# Patient Record
Sex: Female | Born: 1990 | Race: White | Hispanic: No | Marital: Single | Smoking: Current every day smoker
Health system: Southern US, Community
[De-identification: ages and names within clinical notes are randomized; demographics above are authoritative.]

## PROBLEM LIST (undated history)

## (undated) DIAGNOSIS — F329 Major depressive disorder, single episode, unspecified: Secondary | ICD-10-CM

## (undated) DIAGNOSIS — F419 Anxiety disorder, unspecified: Secondary | ICD-10-CM

## (undated) DIAGNOSIS — F32A Depression, unspecified: Secondary | ICD-10-CM

---

## 2014-06-10 ENCOUNTER — Emergency Department (HOSPITAL_COMMUNITY)
Admission: EM | Admit: 2014-06-10 | Discharge: 2014-06-11 | Disposition: A | Discharge status: Home / Self Care | Payer: Federal, State, Local not specified - Other | Attending: Emergency Medicine | Admitting: Emergency Medicine

## 2014-06-10 ENCOUNTER — Encounter (HOSPITAL_COMMUNITY): Payer: Self-pay | Admitting: *Deleted

## 2014-06-10 DIAGNOSIS — Z720 Tobacco use: Secondary | ICD-10-CM | POA: Insufficient documentation

## 2014-06-10 DIAGNOSIS — F323 Major depressive disorder, single episode, severe with psychotic features: Secondary | ICD-10-CM | POA: Diagnosis present

## 2014-06-10 DIAGNOSIS — Z880 Allergy status to penicillin: Secondary | ICD-10-CM | POA: Insufficient documentation

## 2014-06-10 DIAGNOSIS — R45851 Suicidal ideations: Secondary | ICD-10-CM | POA: Insufficient documentation

## 2014-06-10 DIAGNOSIS — Z79899 Other long term (current) drug therapy: Secondary | ICD-10-CM | POA: Insufficient documentation

## 2014-06-10 HISTORY — DX: Anxiety disorder, unspecified: F41.9

## 2014-06-10 HISTORY — DX: Depression, unspecified: F32.A

## 2014-06-10 HISTORY — DX: Major depressive disorder, single episode, unspecified: F32.9

## 2014-06-10 LAB — COMPREHENSIVE METABOLIC PANEL
ALBUMIN: 4.7 g/dL (ref 3.5–5.2)
ALT: 14 U/L (ref 0–35)
AST: 15 U/L (ref 0–37)
Alkaline Phosphatase: 85 U/L (ref 39–117)
Anion gap: 9 (ref 5–15)
BUN: 10 mg/dL (ref 6–23)
CO2: 25 mmol/L (ref 19–32)
CREATININE: 0.62 mg/dL (ref 0.50–1.10)
Calcium: 9.9 mg/dL (ref 8.4–10.5)
Chloride: 107 mmol/L (ref 96–112)
GFR calc Af Amer: >90 mL/min (ref >90)
Glucose, Bld: 97 mg/dL (ref 70–99)
Potassium: 4 mmol/L (ref 3.5–5.1)
Sodium: 141 mmol/L (ref 135–145)
Total Bilirubin: 0.4 mg/dL (ref 0.3–1.2)
Total Protein: 7.4 g/dL (ref 6.0–8.3)

## 2014-06-10 LAB — CBC
HCT: 43.4 % (ref 36.0–46.0)
Hemoglobin: 14.4 g/dL (ref 12.0–15.0)
MCH: 30 pg (ref 26.0–34.0)
MCHC: 33.2 g/dL (ref 30.0–36.0)
MCV: 90.4 fL (ref 78.0–100.0)
PLATELETS: 288 10*3/uL (ref 150–400)
RBC: 4.8 MIL/uL (ref 3.87–5.11)
RDW: 12.6 % (ref 11.5–15.5)
WBC: 7.5 10*3/uL (ref 4.0–10.5)

## 2014-06-10 LAB — RAPID URINE DRUG SCREEN, HOSP PERFORMED
AMPHETAMINES: NOT DETECTED
BENZODIAZEPINES: NOT DETECTED
Barbiturates: NOT DETECTED
Cocaine: NOT DETECTED
OPIATES: NOT DETECTED
Tetrahydrocannabinol: NOT DETECTED

## 2014-06-10 LAB — ACETAMINOPHEN LEVEL

## 2014-06-10 LAB — ETHANOL

## 2014-06-10 LAB — SALICYLATE LEVEL: Salicylate Lvl: <4 mg/dL (ref 2.8–20.0)

## 2014-06-10 MED ORDER — TRAZODONE HCL 50 MG PO TABS
50.0000 mg | ORAL_TABLET | Freq: Every evening | ORAL | Status: DC | PRN
  Administered 2014-06-10: 50 mg via ORAL
  Filled 2014-06-10: qty 1

## 2014-06-10 MED ORDER — ACETAMINOPHEN 325 MG PO TABS
650.0000 mg | ORAL_TABLET | ORAL | Status: DC | PRN
  Administered 2014-06-10: 650 mg via ORAL
  Filled 2014-06-10: qty 2

## 2014-06-10 MED ORDER — ALUM & MAG HYDROXIDE-SIMETH 200-200-20 MG/5ML PO SUSP: 30.0000 mL | ORAL | Status: DC | PRN

## 2014-06-10 MED ORDER — LORAZEPAM 1 MG PO TABS
1.0000 mg | ORAL_TABLET | Freq: Three times a day (TID) | ORAL | Status: DC | PRN
  Administered 2014-06-10: 1 mg via ORAL
  Filled 2014-06-10: qty 1

## 2014-06-10 MED ORDER — IBUPROFEN 200 MG PO TABS
600.0000 mg | ORAL_TABLET | Freq: Three times a day (TID) | ORAL | Status: DC | PRN
  Administered 2014-06-10: 600 mg via ORAL
  Filled 2014-06-10: qty 3

## 2014-06-10 MED ORDER — ONDANSETRON HCL 4 MG PO TABS
4.0000 mg | ORAL_TABLET | Freq: Three times a day (TID) | ORAL | Status: DC | PRN
Start: 2014-06-10 — End: 2014-06-11
  Administered 2014-06-10: 4 mg via ORAL
  Filled 2014-06-10: qty 1

## 2014-06-10 NOTE — BH Assessment (Signed)
Per ED notes pt presented to ED due depression, anxiety, and feeling like she does not want to be here anymore. She has been previously dx with depression 7 years ago, and took prozac but felt it made her a "zombie." She currently is not on medication due to insurance.   Assessment to commence shortly.    Clista BernhardtNancy Sera Hitsman, [facility] Triage Specialist 06/10/2014 10:37 PM

## 2014-06-10 NOTE — ED Notes (Signed)
Pt and one pt belonging bag wanded by security.

## 2014-06-10 NOTE — ED Notes (Signed)
Pt reports depression and severe anxiety.  States she recently moved here to go to school and has been dealing with a lot of stuff lately that it's making her feel like she does not want to be "here" anymore.  No plan.  Pt is teary in triage, however, calm and cooperative. Pt states that she was dx with depression x 7 years ago and took prozac for 5 years which only made her feel like a "zombie."  Pt states that she does not have insurance so she has not taken her meds.  She is requesting help with this.
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
## 2014-06-10 NOTE — ED Notes (Signed)
TTS in progress 

## 2014-06-11 ENCOUNTER — Inpatient Hospital Stay (HOSPITAL_COMMUNITY)
Admission: AD | Admit: 2014-06-11 | Discharge: 2014-06-17 | DRG: 885 | Disposition: A | Discharge status: Home / Self Care | Payer: Federal, State, Local not specified - Other | Source: Intra-hospital | Attending: Psychiatry | Admitting: Psychiatry

## 2014-06-11 ENCOUNTER — Encounter (HOSPITAL_COMMUNITY): Payer: Self-pay | Admitting: *Deleted

## 2014-06-11 DIAGNOSIS — G9340 Encephalopathy, unspecified: Secondary | ICD-10-CM | POA: Diagnosis present

## 2014-06-11 DIAGNOSIS — F4310 Post-traumatic stress disorder, unspecified: Secondary | ICD-10-CM | POA: Diagnosis present

## 2014-06-11 DIAGNOSIS — F323 Major depressive disorder, single episode, severe with psychotic features: Secondary | ICD-10-CM | POA: Diagnosis present

## 2014-06-11 DIAGNOSIS — R45851 Suicidal ideations: Secondary | ICD-10-CM | POA: Diagnosis present

## 2014-06-11 DIAGNOSIS — F17210 Nicotine dependence, cigarettes, uncomplicated: Secondary | ICD-10-CM | POA: Diagnosis present

## 2014-06-11 DIAGNOSIS — G4710 Hypersomnia, unspecified: Secondary | ICD-10-CM | POA: Diagnosis present

## 2014-06-11 DIAGNOSIS — I639 Cerebral infarction, unspecified: Secondary | ICD-10-CM | POA: Diagnosis present

## 2014-06-11 DIAGNOSIS — F410 Panic disorder [episodic paroxysmal anxiety] without agoraphobia: Secondary | ICD-10-CM | POA: Diagnosis present

## 2014-06-11 DIAGNOSIS — F333 Major depressive disorder, recurrent, severe with psychotic symptoms: Secondary | ICD-10-CM | POA: Diagnosis not present

## 2014-06-11 DIAGNOSIS — G4700 Insomnia, unspecified: Secondary | ICD-10-CM | POA: Diagnosis present

## 2014-06-11 DIAGNOSIS — F332 Major depressive disorder, recurrent severe without psychotic features: Secondary | ICD-10-CM

## 2014-06-11 MED ORDER — ACETAMINOPHEN 325 MG PO TABS: 650.0000 mg | ORAL_TABLET | Freq: Four times a day (QID) | ORAL | Status: DC | PRN

## 2014-06-11 MED ORDER — ARIPIPRAZOLE 2 MG PO TABS
2.0000 mg | ORAL_TABLET | Freq: Every day | ORAL | Status: DC
  Filled 2014-06-11: qty 1

## 2014-06-11 MED ORDER — HYDROXYZINE HCL 25 MG PO TABS
25.0000 mg | ORAL_TABLET | Freq: Three times a day (TID) | ORAL | Status: DC | PRN
  Administered 2014-06-13 – 2014-06-17 (×9): 25 mg via ORAL
  Filled 2014-06-11 (×9): qty 1
  Filled 2014-06-11: qty 30

## 2014-06-11 MED ORDER — MAGNESIUM HYDROXIDE 400 MG/5ML PO SUSP: 30.0000 mL | Freq: Every day | ORAL | Status: DC | PRN

## 2014-06-11 MED ORDER — ALUM & MAG HYDROXIDE-SIMETH 200-200-20 MG/5ML PO SUSP: 30.0000 mL | ORAL | Status: DC | PRN

## 2014-06-11 NOTE — ED Notes (Signed)
Patient tearful during assessment and appears anxious. Writer and patient had therapeutic discussion. [facility] but denies HI and AVH at this time. Patient complains of a headache and nausea. Patient rates pain 7/10 using numeric pain scale. Patient will be medicate [facility] for nausea, pain and increase anxiety per MAR. Patient oriented to unit and plan of care discussed. Patient voices no complaints or concerns at this time. Encouragement and support provided and safety maintain. Q 15 min safety checks in place.

## 2014-06-11 NOTE — ED Provider Notes (Signed)
CSN: [id number]     Arrival date & time 06/10/14  1929 History   First MD Initiated Contact with Patient 06/10/14 2003     Chief Complaint  Patient presents with  . Suicidal     (Consider location/radiation/quality/duration/timing/severity/associated sxs/prior Treatment) HPI Patient presents to the emergency department with depression and severe anxiety states that she has had recent life changes that it caused further worsening of her condition.  She states she has had thoughts of suicide and has Formulated a plan, but is not very specific with me.  Patient states that nothing seems make her condition better or worse.  The patient denies hallucinations, chest pain, shortness of breath, weakness, dizziness, headache, blurred vision, nausea, vomiting, diarrhea, abdominal pain, or syncope.  The patient states she is not currently taking any medications for her psychiatric related issues Past Medical History  Diagnosis Date  . Depression   . Anxiety    History reviewed. No pertinent past surgical history. No family history on file. History  Substance Use Topics  . Smoking status: Current Every Day Smoker -- 1.00 packs/day    Types: Cigarettes  . Smokeless tobacco: Not on file  . Alcohol Use: Yes     Comment: social   OB History    No data available     Review of Systems  All other systems negative except as documented in the HPI. All pertinent positives and negatives as reviewed in the HPI.  Allergies  Penicillins  Home Medications   Prior to Admission medications   Medication Sig Start Date End Date Taking? Authorizing Provider  Acetaminophen-Caff-Pyrilamine (MIDOL COMPLETE PO) Take 2 capsules by mouth every 6 (six) hours as needed (cramps).   Yes Historical Provider, MD  hydrOXYzine (ATARAX/VISTARIL) 25 MG tablet Take 25 mg by mouth 3 (three) times daily.   Yes Historical Provider, MD   BP 123/78 mmHg  Pulse 80  Temp(Src) 98.1 F (36.7 C) (Oral)  Resp 16  SpO2 100%   LMP 06/07/2014 Physical Exam  Constitutional: She is oriented to person, place, and time. She appears well-developed and well-nourished. No distress.  HENT:  Head: Normocephalic and atraumatic.  Mouth/Throat: Oropharynx is clear and moist.  Eyes: Pupils are equal, round, and reactive to light.  Neck: Normal range of motion. Neck supple.  Cardiovascular: Normal rate, regular rhythm and normal heart sounds.   Pulmonary/Chest: Effort normal and breath sounds normal. No respiratory distress.  Neurological: She is alert and oriented to person, place, and time. She exhibits normal muscle tone. Coordination normal.  Skin: Skin is warm and dry. No rash noted. No erythema.  Psychiatric: Judgment normal. Her mood appears anxious. She is not agitated and not actively hallucinating. Thought content is not paranoid and not delusional. Cognition and memory are normal. She exhibits a depressed mood. She expresses suicidal ideation. She expresses no homicidal ideation. She expresses suicidal plans. She expresses no homicidal plans. She is attentive.  Nursing note and vitals reviewed.   ED Course  Procedures (including critical care time) Labs Review Labs Reviewed  ACETAMINOPHEN LEVEL - Abnormal; Notable for the following:    Acetaminophen (Tylenol), Serum <10.0 (*)    All other components within normal limits  CBC  COMPREHENSIVE METABOLIC PANEL  ETHANOL  SALICYLATE LEVEL  URINE RAPID DRUG SCREEN (HOSP PERFORMED)     Patient will need TTS assessment   Charlestine NightChristopher Tavone Caesar, PA-C 06/11/14 0146  Elwin MochaBlair Walden, MD 06/17/14 [phone]

## 2014-06-11 NOTE — BH Assessment (Signed)
Per Donell SievertSpencer Simon, PA pt meets inpt criteria and [facility]. Per Bunnie Pionori AC, no [facility] at [facility]. TTS to seek placement.   [place]: [place] [place], [place], [facility], [facility]    Bhavika Schnider, [place] Triage Specialist 06/11/2014 5:31 AM

## 2014-06-11 NOTE — ED Notes (Signed)
Acuity level remains moderate.  Very sad and having suicidal thoughts.

## 2014-06-11 NOTE — Consult Note (Signed)
[facility] Face-to-Face Psychiatry Consult   Reason for Consult:  Major depression, anxiety disorder Referring Physician:  EDP Patient Identification: Kristina Guerrero MRN:  [id number] Principal Diagnosis: Major depressive disorder, recurrent severe without psychotic features Diagnosis:   Patient Active Problem List   Diagnosis Date Noted  . Major depressive disorder, recurrent severe without psychotic features [F33.2] 06/11/2014    Priority: High    Total Time spent with patient: 1 hour  Subjective:   Kristina Guerrero is a 24 y.o. female patient admitted with Major depressive disorder, recurrent, anxiety disorder.  HPI:  Caucasian female, 24 years old was evaluated for severe depression and anxiety.  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  Patient stated " I need help to stop feeling the way I am feeling"  Patient reported that She left her boy friend and her friends to move down to [place] when she dropped out of school to secure a job until she can go back to school.  She reported that thing are not working as planned and that she does not know what to do.  She reports that she is living with two people who are husband and wife and that she is the only one working and paying bills.  Patient  Was tearful during this interview.  Patient reported that she has been in foster care from age 24 to 60 because her mother could not care for her due to her own issues which she did not want to talk about.  Patient was diagnosed with depression and anxiety at age 54 and have been tried on various medications.  She reported that some of the medications she tried made her feel like" a Zombie "  Patient states that she worries about a lot of people including her mother and that adds to her stress.  She denies HI/AVH.  She has been accepted for admission and will be waiting for any available inpatient Psychiatric bed.  Meanwhile she has been started on medications.    HPI Elements:   Location:  Major depression, recurrent, severe, anxiety disorder. Quality:  severe. Severity:  severe. Timing:  acute. Duration:  since age 57 years . Context:  Seeking treatment for severe depression and anxiety.  Past Medical History:  Past Medical History  Diagnosis Date  . Depression   . Anxiety    History reviewed. No pertinent past surgical history. Family History: No family history on file. Social History:  History  Alcohol Use  . Yes    Comment: social     History  Drug Use No    History   Social History  . Marital Status: Single    Spouse Name: N/A  . Number of Children: N/A  . Years of Education: N/A   Social History Main Topics  . Smoking status: Current Every Day Smoker -- 1.00 packs/day    Types: Cigarettes  . Smokeless tobacco: Not on file  . Alcohol Use: Yes     Comment: social  . Drug Use: No  . Sexual Activity: Not on file   Other Topics Concern  . None   Social History Narrative  . None   Additional Social History:    Pain Medications: reports she was taking pain pills not prescribed to her up until about two months ago  Prescriptions: none reported Over the Counter: SEE PTA, reports take midol  History of alcohol / drug use?: Yes Longest period of sobriety (when/how long): a couple  of weeks for THC, about two month for pain medications  Negative Consequences of Use: Financial Withdrawal Symptoms:  (none reported ) Name of Substance 1: etoh  1 - Age of First Use: teens  1 - Amount (size/oz): a glass 1 - Frequency: "when I can afford it, would have a glass before bed nightly if I could" 1 - Duration: years 1 - Last Use / Amount: uncertain Name of Substance 2: THC 2 - Age of First Use: teens  2 - Amount (size/oz): uncertain  2 - Frequency: varied, reported recently made her feel paranoid so stopped a couple of weeks ago  2 - Duration: on and off for years 2 - Last Use / Amount: "sometime this month" Name of  Substance 3: opiate pain pills 3 - Age of First Use: teens 3 - Amount (size/oz): varied reports sometimes took for pain, and other times abused it, it was never prescribed 3 - Frequency: daily at times 3 - Duration: years 3 - Last Use / Amount: a couple of months ago Name of Substance 4: cocaine 4 - Age of First Use: teens 4 - Amount (size/oz): unknown reports experimented a couple of times 4 - Frequency: a few times Name of Substance 5: xanax, klonopin 5 - Age of First Use: teens 5 - Amount (size/oz): reports took a few times but did not like it because it made her go to sleep Name of Substance 6: ambien 43 - Age of First Use: teens  6 - Amount (size/oz): uncertain         Allergies:   Allergies  Allergen Reactions  . Penicillins     Labs:  Results for orders placed or performed during the hospital encounter of 06/10/14 (from the past 48 hour(s))  Acetaminophen level     Status: Abnormal   Collection Time: 06/10/14  8:25 PM  Result Value Ref Range   Acetaminophen (Tylenol), Serum <10.0 (L) 10 - 30 ug/mL    Comment:        THERAPEUTIC CONCENTRATIONS VARY SIGNIFICANTLY. A RANGE OF 10-30 ug/mL MAY BE AN EFFECTIVE CONCENTRATION FOR MANY PATIENTS. HOWEVER, SOME ARE BEST TREATED AT CONCENTRATIONS OUTSIDE THIS RANGE. ACETAMINOPHEN CONCENTRATIONS >150 ug/mL AT 4 HOURS AFTER INGESTION AND >50 ug/mL AT 12 HOURS AFTER INGESTION ARE OFTEN ASSOCIATED WITH TOXIC REACTIONS.   CBC     Status: None   Collection Time: 06/10/14  8:25 PM  Result Value Ref Range   WBC 7.5 4.0 - 10.5 K/uL   RBC 4.80 3.87 - 5.11 MIL/uL   Hemoglobin 14.4 12.0 - 15.0 g/dL   HCT 43.4 36.0 - 46.0 %   MCV 90.4 78.0 - 100.0 fL   MCH 30.0 26.0 - 34.0 pg   MCHC 33.2 30.0 - 36.0 g/dL   RDW 12.6 11.5 - 15.5 %   Platelets 288 150 - 400 K/uL  Comprehensive metabolic panel     Status: None   Collection Time: 06/10/14  8:25 PM  Result Value Ref Range   Sodium 141 135 - 145 mmol/L   Potassium 4.0 3.5 - 5.1  mmol/L   Chloride 107 96 - 112 mmol/L   CO2 25 19 - 32 mmol/L   Glucose, Bld 97 70 - 99 mg/dL   BUN 10 6 - 23 mg/dL   Creatinine, Ser 0.62 0.50 - 1.10 mg/dL   Calcium 9.9 8.4 - 10.5 mg/dL   Total Protein 7.4 6.0 - 8.3 g/dL   Albumin 4.7 3.5 - 5.2 g/dL   AST  15 0 - 37 U/L   ALT 14 0 - 35 U/L   Alkaline Phosphatase 85 39 - 117 U/L   Total Bilirubin 0.4 0.3 - 1.2 mg/dL   GFR calc non Af Amer >90 >90 mL/min   GFR calc Af Amer >90 >90 mL/min    Comment: (NOTE) The eGFR has been calculated using the CKD EPI equation. This calculation has not been validated in all clinical situations. eGFR's persistently <90 mL/min signify possible Chronic Kidney Disease.    Anion gap 9 5 - 15  Ethanol (ETOH)     Status: None   Collection Time: 06/10/14  8:25 PM  Result Value Ref Range   Alcohol, Ethyl (B) <5 0 - 9 mg/dL    Comment:        LOWEST DETECTABLE LIMIT FOR SERUM ALCOHOL IS 11 mg/dL FOR MEDICAL PURPOSES ONLY   Salicylate level     Status: None   Collection Time: 06/10/14  8:25 PM  Result Value Ref Range   Salicylate Lvl <9.7 2.8 - 20.0 mg/dL  Urine Drug Screen     Status: None   Collection Time: 06/10/14  9:23 PM  Result Value Ref Range   Opiates NONE DETECTED NONE DETECTED   Cocaine NONE DETECTED NONE DETECTED   Benzodiazepines NONE DETECTED NONE DETECTED   Amphetamines NONE DETECTED NONE DETECTED   Tetrahydrocannabinol NONE DETECTED NONE DETECTED   Barbiturates NONE DETECTED NONE DETECTED    Comment:        DRUG SCREEN FOR MEDICAL PURPOSES ONLY.  IF CONFIRMATION IS NEEDED FOR ANY PURPOSE, NOTIFY LAB WITHIN 5 DAYS.        LOWEST DETECTABLE LIMITS FOR URINE DRUG SCREEN Drug Class       Cutoff (ng/mL) Amphetamine      1000 Barbiturate      200 Benzodiazepine   026 Tricyclics       378 Opiates          300 Cocaine          300 THC              50     Vitals: Blood pressure 93/65, pulse 63, temperature 98.1 F (36.7 C), temperature source Oral, resp. rate 16, last  menstrual period 06/07/2014, SpO2 100 %.  Risk to Self: Suicidal Ideation: Yes-Currently Present Suicidal Intent: No Is patient at risk for suicide?: Yes Suicidal Plan?: No Access to Means: No What has been your use of drugs/alcohol within the last 12 months?: Pt reports using etoh, THC, opiates, benzos, sleeping pills starting in her teens. Currently she reports only use of etoh. She reports she stopped THC a couple of weeks ago, and pain pills about two months ago.  How many times?: 1 Other Self Harm Risks: none Triggers for Past Attempts: Other (Comment) (in placement) Intentional Self Injurious Behavior: Cutting (pinches self, touches sharps) Comment - Self Injurious Behavior: reports she cut as a teen, currently pinches herself and touches sharp things Risk to Others: Homicidal Ideation: No Thoughts of Harm to Others: No-Not Currently Present/Within Last 6 Months Current Homicidal Intent: No Current Homicidal Plan: No Access to Homicidal Means: No Identified Victim: none History of harm to others?: No Assessment of Violence: None Noted Violent Behavior Description: reports gets upset and has to leave room to avoid hitting, reprots violent towards objects not people  Does patient have access to weapons?: No Criminal Charges Pending?: No Does patient have a court date: No Prior Inpatient Therapy: Prior Inpatient Therapy:  No Prior Therapy Dates: NA Prior Therapy Facilty/Provider(s): NA Reason for Treatment: NA Prior Outpatient Therapy: Prior Outpatient Therapy: Yes Prior Therapy Dates: when she was 17 Prior Therapy Facilty/Provider(s): unknown Reason for Treatment: depression, anxiety, in placement   Current Facility-Administered Medications  Medication Dose Route Frequency Provider Last Rate Last Dose  . acetaminophen (TYLENOL) tablet 650 mg  650 mg Oral Q4H PRN Dalia Heading, PA-C   650 mg at 06/10/14 2151  . alum & mag hydroxide-simeth (MAALOX/MYLANTA) 200-200-20 MG/5ML  suspension 30 mL  30 mL Oral PRN Dalia Heading, PA-C      . ARIPiprazole (ABILIFY) tablet 2 mg  2 mg Oral QHS Babygirl Trager      . ibuprofen (ADVIL,MOTRIN) tablet 600 mg  600 mg Oral Q8H PRN Dalia Heading, PA-C   600 mg at 06/10/14 2340  . LORazepam (ATIVAN) tablet 1 mg  1 mg Oral Q8H PRN Dalia Heading, PA-C   1 mg at 06/10/14 2150  . ondansetron (ZOFRAN) tablet 4 mg  4 mg Oral Q8H PRN Dalia Heading, PA-C   4 mg at 06/10/14 2150  . traZODone (DESYREL) tablet 50 mg  50 mg Oral QHS,MR X 1 Laverle Hobby, PA-C   50 mg at 06/10/14 2340   Current Outpatient Prescriptions  Medication Sig Dispense Refill  . Acetaminophen-Caff-Pyrilamine (MIDOL COMPLETE PO) Take 2 capsules by mouth every 6 (six) hours as needed (cramps).    . hydrOXYzine (ATARAX/VISTARIL) 25 MG tablet Take 25 mg by mouth 3 (three) times daily.      Musculoskeletal: Strength & Muscle Tone: within normal limits Gait & Station: normal Patient leans: N/A  Psychiatric Specialty Exam:     Blood pressure 93/65, pulse 63, temperature 98.1 F (36.7 C), temperature source Oral, resp. rate 16, last menstrual period 06/07/2014, SpO2 100 %.There is no height or weight on file to calculate BMI.  General Appearance: Casual and Fairly Groomed  Eye Contact::  Good  Speech:  Clear and Coherent and Normal Rate  Volume:  Normal  Mood:  Angry, Anxious, Depressed, Hopeless and helpless  Affect:  Congruent, Depressed and Flat  Thought Process:  Coherent, Goal Directed and Intact  Orientation:  Full (Time, Place, and Person)  Thought Content:  WDL  Suicidal Thoughts:  Yes.  without intent/plan  Homicidal Thoughts:  No  Memory:  Immediate;   Good Recent;   Good Remote;   Good  Judgement:  Fair  Insight:  Fair  Psychomotor Activity:  Psychomotor Retardation  Concentration:  Good  Recall:  NA  Fund of Knowledge:Fair  Language: Good  Akathisia:  NA  Handed:  Right  AIMS (if indicated):     Assets:  Desire for  Improvement  ADL's:  Intact  Cognition: WNL  Sleep:      Medical Decision Making: Review of Psycho-Social Stressors (1), Established Problem, Worsening (2), Review of Medication Regimen & Side Effects (2) and Review of New Medication or Change in Dosage (2)  Treatment Plan Summary: Daily contact with patient to assess and evaluate symptoms and progress in treatment, Medication management and Plan accepted for admission and will be waiting for bed availability  Plan:  Recommend psychiatric Inpatient admission when medically cleared. Disposition: see above  Delfin Gant   PMHNP-BC 06/11/2014 4:53 PM Patient seen face-to-face for psychiatric evaluation, chart reviewed and case discussed with the physician extender and developed treatment plan. Reviewed the information documented and agree with the treatment plan. Corena Pilgrim, MD

## 2014-06-11 NOTE — ED Notes (Signed)
Acuity level moderate.  Patient is very depressed and has limited insight.

## 2014-06-11 NOTE — ED Notes (Signed)
Patient reports a decrease in headache but still has minor pain. Patient also request something to help her sleep. Karleen HampshireSpencer, PA notified and new orders received.  Patient will be medicated per MAR. Encouragement and support provided and safety maintain. Q 15 min safety checks remain in place.

## 2014-06-11 NOTE — Progress Notes (Signed)
[facility], tearful but cooperative. Pt presented to ED c/o +SI, no plan, depression and severe anxiety. Reports stressors are moving here to go to school dropped out due to issues with financial aid, new job, living with couple and paying the bills; also worried about her mother (who has been in/out of the hospital).  Hx of depression since age 24, in foster care from age [age], one suicide attempt as a teenager and noncompliant with medications, stopped due to no insurance. -HI, -A/[place] at present but reported seeing shadows,  hearing  voices making negative comments to her,  telling her to give up or jump off a bridge and sometimes encouraging comments. c/o crying spells , anxiety, panic attacks, racing thoughts, worrying, loss of motivation, feeling hopeless and helpless. Admits to a history of drug abuse but reported stopping two months ago. Emotional support and encouragement given. Pt admitted for evaluation, stabilization and reduction of baseline. Will monitor closely.

## 2014-06-11 NOTE — [facility] Counselor (Signed)
[facility] by Josephine,NP and Dr. Jannifer FranklinAkintayo. Room assignment is 402-2. Nursing report [id number]. Support paperwork completed.

## 2014-06-11 NOTE — Tx Team (Signed)
Initial Interdisciplinary Treatment Plan   PATIENT STRESSORS: Educational concerns Financial difficulties Health problems Marital or family conflict Medication change or noncompliance Occupational concerns Substance abuse   PATIENT STRENGTHS: Ability for insight Active sense of humor Average or above average intelligence Communication skills Motivation for treatment/growth Supportive family/friends Work skills   PROBLEM LIST: Problem List/Patient Goals Date to be addressed Date deferred Reason deferred Estimated date of resolution  Depression "I need to get help and better" 06/11/14   At d/c  Suicidal ideation 06/11/14   At d/c  anxiety 06/11/14   At d/c  Substance abuse 06/11/14   At d/c  psychosis 06/11/14   At d/c                           DISCHARGE CRITERIA:  Ability to meet basic life and health needs Improved stabilization in mood, thinking, and/or behavior Motivation to continue treatment in a less acute level of care Need for constant or close observation no longer present  PRELIMINARY DISCHARGE PLAN: Outpatient therapy Return to previous living arrangement Return to previous work or school arrangements  PATIENT/FAMIILY INVOLVEMENT: This treatment plan has been presented to and reviewed with the patient, Kristina Guerrero.  The patient and family have been given the opportunity to ask questions and make suggestions.  Celene KrasRobinson, Luie Laneve G 06/11/2014, 10:22 PM

## 2014-06-11 NOTE — ED Notes (Signed)
Acuity level is moderate.  Patient admitted with suicidal thoughts, no plan.  Very tearful.

## 2014-06-12 ENCOUNTER — Encounter (HOSPITAL_COMMUNITY): Payer: Self-pay | Admitting: Psychiatry

## 2014-06-12 DIAGNOSIS — F333 Major depressive disorder, recurrent, severe with psychotic symptoms: Principal | ICD-10-CM

## 2014-06-12 LAB — TSH: TSH: 0.661 u[IU]/mL (ref 0.350–4.500)

## 2014-06-12 MED ORDER — NICOTINE 21 MG/24HR TD PT24
21.0000 mg | MEDICATED_PATCH | Freq: Every day | TRANSDERMAL | Status: DC
  Filled 2014-06-12 (×7): qty 1

## 2014-06-12 MED ORDER — CITALOPRAM HYDROBROMIDE 20 MG PO TABS
20.0000 mg | ORAL_TABLET | Freq: Every day | ORAL | Status: DC
  Administered 2014-06-12 – 2014-06-17 (×6): 20 mg via ORAL
  Filled 2014-06-12 (×7): qty 1

## 2014-06-12 MED ORDER — ENSURE ENLIVE PO LIQD
237.0000 mL | Freq: Two times a day (BID) | ORAL | Status: DC
  Administered 2014-06-12 – 2014-06-16 (×6): 237 mL via ORAL

## 2014-06-12 MED ORDER — QUETIAPINE FUMARATE 25 MG PO TABS
25.0000 mg | ORAL_TABLET | Freq: Four times a day (QID) | ORAL | Status: DC | PRN
  Administered 2014-06-12: 25 mg via ORAL
  Filled 2014-06-12: qty 1
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
## 2014-06-12 NOTE — Progress Notes (Signed)
Recreation Therapy Notes  Date: 04.08.2016 Time: 9:30am Location: [address] Group Room   Group Topic: Stress Management  Goal Area(s) Addresses:  Patient will actively participate in stress management techniques presented during session.   Behavioral Response: Did not attend.   Sheryn Aldaz L Doyal Saric, LRT/CTRS  Karia Ehresman L 06/12/2014 6:25 PM 
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
## 2014-06-12 NOTE — Progress Notes (Signed)
Pt calm and cooperative. Pt has been interactive in the dayroom majority of afternoon. Pt denies any SI/HI/AH/VH. Pt compliant with treatment plan. Pt did eat lunch and take meds as directed. No current concerns. RN will continue to monitor.

## 2014-06-12 NOTE — BHH Group Notes (Signed)
   [facility]  06/12/2014  8:45 AM   Participation Quality: Alert, Appropriate and Oriented  Mood/Affect: Depressed and Flat  Depression Rating: 7  Anxiety Rating: 7-8  Thoughts of Suicide: Pt endorses passive SI but contracts for safety   Will you contract for safety? Yes  Current AVH: Pt denies  Plan for Discharge/Comments: Pt attended discharge planning group and actively participated in group. CSW provided pt with today's workbook. Patient reports that she plans to return home to follow up with outpatient services. Patient tearful with depressed and flat affect during group.   Transportation Means: Pt reports access to transportation  Supports: No supports mentioned at this time  Samuella BruinKristin Kassidie Hendriks, MSW, [employer]CSWA Clinical Social Worker [facility] [phone]
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
## 2014-06-12 NOTE — Progress Notes (Signed)
NUTRITION ASSESSMENT  Pt identified as at risk on the Malnutrition Screen Tool  INTERVENTION: 1. Educated patient on the importance of nutrition and encouraged intake of food and beverages. 2. Discussed weight goals. 3. Supplements: Ensure Enlive po BID, each supplement provides 350 kcal and 20 grams of protein  NUTRITION DIAGNOSIS: Unintentional weight loss related to sub-optimal intake as evidenced by pt report.   Goal: Pt to meet >/= 90% of their estimated nutrition needs.  Monitor:  PO intake  Assessment:  Pt admitted with depression and anxiety. Past Hx of drug abuse.   Pt reports poor appetite and weight loss ever since she moved to [place] x 1 month ago. UBW is 160 lb, now weighs 145 lb.  Pt would like to try Ensure supplements, strawberry flavor. RD to order.   Height: Ht Readings from Last 1 Encounters:  06/11/14 5\' 3"  (1.6 m)    Weight: Wt Readings from Last 1 Encounters:  06/11/14 145 lb (65.772 kg)    Weight Hx: Wt Readings from Last 10 Encounters:  06/11/14 145 lb (65.772 kg)    BMI:  Body mass index is 25.69 kg/(m^2). Pt meets criteria for overweight based on current BMI.  Estimated Nutritional Needs: Kcal: 25-30 kcal/kg Protein: > 1 gram protein/kg Fluid: 1 ml/kcal  Diet Order: Diet regular Room service appropriate?: Yes; Fluid consistency:: Thin Pt is also offered choice of unit snacks mid-morning and mid-afternoon.  Pt is eating as desired.   Lab results and medications reviewed.   Tilda FrancoLindsey Lajarvis Italiano, MS, RD, LDN Pager: [phone] After Hours Pager: [phone]
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
## 2014-06-12 NOTE — Progress Notes (Signed)
Writer has observed patient up in the dayroom shortly with minimal interaction with peers. Writer spoke with her 1:1 and she reports having had a good day since being started on medication and she had a visitor today. She did c/o feeling anxious and requested medication to aid with this and she received a prn of seroquel.. She denies si/hi/a/v hallucinations. Support and encouargement given, safety maintained on unit with 15 min checks. Writer will monitor effectiveness of seroquel given.

## 2014-06-12 NOTE — [facility] Group Notes (Signed)
B[facility] LCSW Group Therapy 06/12/2014 1:15 PM Type of Therapy: Group Therapy Participation Level: Active  Participation Quality: Attentive, Sharing and Supportive  Affect: Depressed and Flat  Cognitive: Alert and Oriented  Insight: Developing/Improving and Engaged  Engagement in Therapy: Developing/Improving and Engaged  Modes of Intervention: Clarification, Confrontation, Discussion, Education, Exploration, Limit-setting, Orientation, Problem-solving, Rapport Building, Dance movement psychotherapisteality Testing, Socialization and Support  Summary of Progress/Problems: The topic for today was feelings about relapse. Pt discussed what relapse prevention is to them and identified triggers that they are on the path to relapse. Pt processed their feeling towards relapse and was able to relate to peers. Pt discussed coping skills that can be used for relapse prevention. Patient identified his relapse behavior as giving up on herself and endorsed feelings of hopelessness. Patient engaged in discussion about relapse behaviors and recovery factors. Patient was tearful throughout discussion. CSW and other group members provided emotional support and encouragement.   Samuella BruinKristin Alix Stowers, MSW, [employer]CSWA Clinical Social Worker [facility] [phone]

## 2014-06-12 NOTE — Progress Notes (Signed)
[facility] Group Notes:  (Nursing/MHT/Case Management/Adjunct)  Date:  06/12/2014  Time:  11:34 PM  Type of Therapy:  Group Therapy  Participation Level:  Active  Participation Quality:  Appropriate  Affect:  Appropriate  Cognitive:  Appropriate  Insight:  Appropriate  Engagement in Group:  Engaged  Modes of Intervention:  Socialization and Support  Summary of Progress/Problems: Pt. Was engaged and had good insight in group discussion of relapse prevention.  Pt. Stated she would like to develop more coping skills.  Pt. Stated she plans on "will power" when discharged to prevent substance abuse.  Sondra ComeWilson, Rowyn Spilde J 06/12/2014, 11:34 PM

## 2014-06-12 NOTE — Progress Notes (Signed)
Patient ID: Kristina Guerrero, female   DOB: Apr 20, 1990, 24 y.o.   MRN: [id number]  D: Patient went to bed as soon as she was brought back to the unit. Woke up one time asking where the bathroom was and the general process of the unit then she went back to bed. A: Staff will monitor on q 15 minute checks, follow treatment plan, and give meds as ordered. R: Cooperative on the unit.

## 2014-06-13 ENCOUNTER — Encounter (HOSPITAL_COMMUNITY): Payer: Self-pay | Admitting: Nurse Practitioner

## 2014-06-13 LAB — PREGNANCY, URINE: Preg Test, Ur: NEGATIVE

## 2014-06-13 MED ORDER — BUSPIRONE HCL 15 MG PO TABS
7.5000 mg | ORAL_TABLET | Freq: Two times a day (BID) | ORAL | Status: DC
  Administered 2014-06-13 – 2014-06-16 (×7): 7.5 mg via ORAL
  Filled 2014-06-13 (×10): qty 1

## 2014-06-13 NOTE — Progress Notes (Signed)
[facility]  06/13/2014 2:25 PM Kristina Guerrero  MRN:  [id number] Subjective:  "I'm still feeling down.  A lot coming at me at one time" Objective:  [place]  She is a [employer] student that has recently lost her status for acceptance and financial aid..  She states that her mother also has depression.  And she misses her family.   She denies previous mental health treatment.   Principal Problem: Major depressive disorder with psychotic features Diagnosis:   Patient Active Problem List   Diagnosis Date Noted  . 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 Suicidal ideations [R45.851]    Total Time spent with patient: 30 minutes   Past Medical History:  Past Medical History  Diagnosis Date  . Depression   . Anxiety    History reviewed. No pertinent past surgical history. Family History: History reviewed. No pertinent family history. Social History:  History  Alcohol Use  . Yes    Comment: social     History  Drug Use No    History   Social History  . Marital Status: Single    Spouse Name: N/A  . Number of Children: N/A  . Years of Education: N/A   Social History Main Topics  . Smoking status: Current Every Day Smoker -- 1.00 packs/day    Types: Cigarettes  . Smokeless tobacco: Not on file  . Alcohol Use: Yes     Comment: social  . Drug Use: No  . Sexual Activity: Not Currently   Other Topics Concern  . None   Social History Narrative   Additional History:    Sleep: Fair  Appetite:  Fair   Assessment:   Musculoskeletal: Strength & Muscle Tone: within normal limits Gait & Station: normal Patient leans: N/A   Psychiatric Specialty Exam: Physical Exam  Vitals reviewed. Psychiatric: Her mood appears anxious. She exhibits a depressed mood.    Review of Systems  Psychiatric/Behavioral: Positive for depression. The patient is nervous/anxious.   All other systems reviewed and are  negative.   Blood pressure 117/67, pulse 62, temperature 98.6 F (37 C), temperature source Oral, resp. rate 16, height 5\' 3"  (1.6 m), weight 65.772 kg (145 lb), last menstrual period 06/07/2014.Body mass index is 25.69 kg/(m^2).   General Appearance: Casual and Fairly Groomed  Patent attorneyye Contact:: Poor  Speech: Clear and Coherent and Normal Rate  Volume: Normal  Mood: Dysphoric and Hopeless  Affect: Tearful  Thought Process: Coherent and Goal Directed  Orientation: Full (Time, Place, and Person)  Thought Content: Hallucinations: Auditory Visual and Rumination  Suicidal Thoughts: Yes. without intent/plan  Homicidal Thoughts: No  Memory: Immediate; Good Recent; Good Remote; Good  Judgement: Fair  Insight: Fair  Psychomotor Activity: Psychomotor Retardation  Concentration: Good  Recall: Good  Fund of Knowledge:Fair  Language: Good  Akathisia: No  Handed: Right  AIMS (if indicated):    Assets: Communication Skills Desire for Improvement Leisure Time Physical Health Resilience Social Support Vocational/Educational  ADL's: Intact  Cognition: WNL  Sleep: Number of Hours: 6.5        Current Medications: Current Facility-Administered Medications  Medication Dose Route Frequency Provider Last Rate Last Dose  . acetaminophen (TYLENOL) tablet 650 mg  650 mg Oral Q6H PRN Worthy FlankIjeoma E Nwaeze, NP      . alum & mag hydroxide-simeth (MAALOX/MYLANTA) 200-200-20 MG/5ML suspension 30 mL  30 mL Oral Q4H PRN Worthy FlankIjeoma E Nwaeze, NP      .  busPIRone (BUSPAR) tablet 7.5 mg  7.5 mg Oral BID Adonis Brook, NP      . citalopram (CELEXA) tablet 20 mg  20 mg Oral Daily Thermon Leyland, NP   20 mg at 06/13/14 1324  . feeding supplement (ENSURE ENLIVE) (ENSURE ENLIVE) liquid 237 mL  237 mL Oral BID BM Tilda Franco, RD   237 mL at 06/13/14 1100  . hydrOXYzine (ATARAX/VISTARIL) tablet 25 mg  25 mg Oral TID PRN Worthy Flank, NP   25 mg at 06/13/14 1300   . magnesium hydroxide (MILK OF MAGNESIA) suspension 30 mL  30 mL Oral Daily PRN Worthy Flank, NP      . nicotine (NICODERM CQ - dosed in mg/24 hours) patch 21 mg  21 mg Transdermal Daily Worthy Flank, NP   21 mg at 06/12/14 0800    Lab Results:  Results for orders placed or performed during the hospital encounter of 06/11/14 (from the past 48 hour(s))  TSH     Status: None   Collection Time: 06/12/14  6:26 AM  Result Value Ref Range   TSH 0.661 0.350 - 4.500 uIU/mL    Comment: Performed at [facility]    Physical Findings: AIMS: Facial and Oral Movements Muscles of Facial Expression: None, normal Lips and Perioral Area: None, normal Jaw: None, normal Tongue: None, normal,Extremity Movements Upper (arms, wrists, hands, fingers): None, normal Lower (legs, knees, ankles, toes): None, normal, Trunk Movements Neck, shoulders, hips: None, normal, Overall Severity Severity of abnormal movements (highest score from questions above): None, normal Incapacitation due to abnormal movements: None, normal Patient's awareness of abnormal movements (rate only patient's report): No Awareness, Dental Status Current problems with teeth and/or dentures?: No Does patient usually wear dentures?: No  CIWA:  CIWA-Ar Total: 1 COWS:  COWS Total Score: 1  Treatment Plan Summary: Review of chart, vital signs, medications, and notes.  1-Individual and group therapy  2-Medication management for depression and anxiety: Medications reviewed with the patient .  Seroquel dc due adverse side effect, hypersomnia and nausea.  Added Buspar. 3-Coping skills for depression, anxiety  4-Continue crisis stabilization and management  5-Address health issues--monitoring vital signs, stable  6-Treatment plan in progress to prevent relapse of depression and anxiety  Medical Decision Making:  Review of Psycho-Social Stressors (1), Discuss test with performing physician (1), Decision to obtain old  records (1), Review and summation of old records (2), New Problem, with no additional work-up planned (3) and Review of New Medication or Change in Dosage (2)  Kristina Guerrero AGNP-BC 06/13/2014, 2:25 PM I agreed with findings and treatment plan of this patient

## 2014-06-13 NOTE — Progress Notes (Signed)
Patient ID: Kristina Guerrero, female   DOB: 13-Sep-1990, 24 y.o.   MRN: [id number]   D: Pt informed the writer that she feels better. Stated, "better but not there yet". Pt stated, "I want to make sure I'm on the right medicine". Stated she also is concerned about losing her job. Stated her roommate informed her that she's on the schedule to work Thursday.  A:  Support and encouragement was offered. 15 min checks continued for safety.  R: Pt remains safe.

## 2014-06-13 NOTE — Plan of Care (Signed)
Problem: Consults Goal: Depression Patient Education See Patient Education Module for education specifics.  Outcome: Completed/Met Date Met:  06/13/14 Nurse discussed depression/coping skills with patient.

## 2014-06-13 NOTE — Plan of Care (Signed)
Problem: Alteration in mood Goal: LTG-Patient reports reduction in suicidal thoughts (Patient reports reduction in suicidal thoughts and is able to verbalize a safety plan for whenever patient is feeling suicidal)  Outcome: Progressing Patient denies suicidal ideations      

## 2014-06-13 NOTE — [facility] Group Notes (Signed)
The focus of this group is to educate the patient on the purpose and policies of crisis stabilization and provide a format to answer questions about their admission.  The group details unit policies and expectations of patients while admitted.  Patient did not attend 0900 nurse education orientation group this morning.  Patient stayed in bed.   

## 2014-06-13 NOTE — [facility] Group Notes (Signed)
[facility] Group Notes: (Clinical Social Work)   06/13/2014      Type of Therapy:  Group Therapy   Participation Level:  Did Not Attend despite MHT prompting   Tavion Senkbeil Grossman-Orr, LCSW 06/13/2014, 11:48 AM     

## 2014-06-13 NOTE — Progress Notes (Signed)
D:  Patient's self inventory sheet, patient slept good last night, sleep medication was helpful.  Poor appetite, low energy level, poor concentration.  Rated depression 5-6, hopeless 4-5, anxiety 10.  Denied withdrawals.  Denied SI.  Denied physical problems.  Goal today is to feel better.  Plans to talk to MD and SW.  Plans to discharge home when possible.  Does need financial assistance for medications after discharge.  Patient is worried about losing her job. A:  Medications administered per MD orders.  Emotional support and encouragement given patient. R:  Denied SI & HI, contracts for safety.  Denied A/V hallucinations.  Denied pain.  Safety maintained with 15 minute checks.

## 2014-06-14 DIAGNOSIS — F323 Major depressive disorder, single episode, severe with psychotic features: Secondary | ICD-10-CM

## 2014-06-14 DIAGNOSIS — R45851 Suicidal ideations: Secondary | ICD-10-CM

## 2014-06-14 MED ORDER — TRAZODONE HCL 50 MG PO TABS
50.0000 mg | ORAL_TABLET | Freq: Every evening | ORAL | Status: DC | PRN
  Filled 2014-06-14: qty 14

## 2014-06-14 NOTE — Progress Notes (Signed)
Patient ID: Kristina Guerrero, female   DOB: June 10, 1990, 24 y.o.   [id number] [facility]  06/14/2014 3:11 PM Kristina Guerrero  MRN:  [id number] Subjective:  Patient states "I do feel calmer. I am in a better place than I was on Friday. My depression is better. Still have some trouble sleeping because of everything going on in my mind. I got upset with my roommate because he just quit his job and that will affect the rent. I also worry about being able to afford my medications after I leave."   Objective:  Patient is seen and chart is reviewed. The patient is observed interacting well on the unit and is attending the scheduled groups. Kristina Guerrero seems to have made a great deal of progress since Friday. She was pleasant during assessment and noted to be much less tearful today. Patient is complaint with her medications. She is reporting some mild nausea likely from starting SSRI that was recently started. However, patient is still eating well and is still tolerating the medications. Patient continues to worry about psychosocial stressors but her mood is much less dysphoric when discussing them. Buspar was added yesterday to target symptoms of anxiety. Reports that her psychotic symptoms have improved and denies hallucinations today.   Principal Problem: Major depressive disorder with psychotic features Diagnosis:   Patient Active Problem List   Diagnosis Date Noted  . 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 Suicidal ideations [R45.851]    Total Time spent with patient: 20 minutes  Past Medical History:  Past Medical History  Diagnosis Date  . Depression   . Anxiety    History reviewed. No pertinent past surgical history. Family History: History reviewed. No pertinent family history. Social History:  History  Alcohol Use  . Yes    Comment: social     History  Drug Use No    History   Social History  . Marital Status: Single    Spouse  Name: N/A  . Number of Children: N/A  . Years of Education: N/A   Social History Main Topics  . Smoking status: Current Every Day Smoker -- 1.00 packs/day    Types: Cigarettes  . Smokeless tobacco: Not on file  . Alcohol Use: Yes     Comment: social  . Drug Use: No  . Sexual Activity: Not Currently   Other Topics Concern  . None   Social History Narrative   Additional History:    Sleep: Fair  Appetite:  Fair  Assessment:   Musculoskeletal: Strength & Muscle Tone: within normal limits Gait & Station: normal Patient leans: N/A   Psychiatric Specialty Exam: Physical Exam  Vitals reviewed. Psychiatric: Her mood appears anxious. She exhibits a depressed mood.    Review of Systems  Psychiatric/Behavioral: Positive for depression and suicidal ideas. Negative for hallucinations, memory loss and substance abuse. The patient is nervous/anxious and has insomnia.     Blood pressure 107/74, pulse 67, temperature 98 F (36.7 C), temperature source Oral, resp. rate 16, height 5\' 3"  (1.6 m), weight 65.772 kg (145 lb), last menstrual period 06/07/2014.Body mass index is 25.69 kg/(m^2).   General Appearance: Casual and Fairly Groomed  Patent attorneyye Contact:: Poor  Speech: Clear and Coherent and Normal Rate  Volume: Normal  Mood: Depressed  Affect:Anxious   Thought Process: Coherent and Goal Directed  Orientation: Full (Time, Place, and Person)  Thought Content:Symptoms, Worries  Suicidal Thoughts: Yes. without intent/plan  Homicidal Thoughts: No  Memory: Immediate; Good  Recent; Good Remote; Good  Judgement: Fair  Insight: Fair  Psychomotor Activity:WNL  Concentration: Good  Recall: Good  Fund of Knowledge:Fair  Language: Good  Akathisia: No  Handed: Right  AIMS (if indicated):    Assets: Communication Skills Desire for Improvement Leisure Time Physical Health Resilience Social Support Vocational/Educational  ADL's: Intact   Cognition: WNL  Sleep: Number of Hours: 6.5        Current Medications: Current Facility-Administered Medications  Medication Dose Route Frequency Provider Last Rate Last Dose  . acetaminophen (TYLENOL) tablet 650 mg  650 mg Oral Q6H PRN Worthy Flank, NP      . alum & mag hydroxide-simeth (MAALOX/MYLANTA) 200-200-20 MG/5ML suspension 30 mL  30 mL Oral Q4H PRN Worthy Flank, NP      . busPIRone (BUSPAR) tablet 7.5 mg  7.5 mg Oral BID Adonis Brook, NP   7.5 mg at 06/14/14 0805  . citalopram (CELEXA) tablet 20 mg  20 mg Oral Daily Thermon Leyland, NP   20 mg at 06/14/14 0805  . feeding supplement (ENSURE ENLIVE) (ENSURE ENLIVE) liquid 237 mL  237 mL Oral BID BM Tilda Franco, RD   237 mL at 06/13/14 1500  . hydrOXYzine (ATARAX/VISTARIL) tablet 25 mg  25 mg Oral TID PRN Worthy Flank, NP   25 mg at 06/14/14 1301  . magnesium hydroxide (MILK OF MAGNESIA) suspension 30 mL  30 mL Oral Daily PRN Worthy Flank, NP      . nicotine (NICODERM CQ - dosed in mg/24 hours) patch 21 mg  21 mg Transdermal Daily Worthy Flank, NP   21 mg at 06/12/14 0800    Lab Results:  Results for orders placed or performed during the hospital encounter of 06/11/14 (from the past 48 hour(s))  Pregnancy, urine     Status: None   Collection Time: 06/13/14 10:52 AM  Result Value Ref Range   Preg Test, Ur NEGATIVE NEGATIVE    Comment:        THE SENSITIVITY OF THIS METHODOLOGY IS >20 mIU/mL. Performed at [facility]     Physical Findings: AIMS: Facial and Oral Movements Muscles of Facial Expression: None, normal Lips and Perioral Area: None, normal Jaw: None, normal Tongue: None, normal,Extremity Movements Upper (arms, wrists, hands, fingers): None, normal Lower (legs, knees, ankles, toes): None, normal, Trunk Movements Neck, shoulders, hips: None, normal, Overall Severity Severity of abnormal movements (highest score from questions above): None, normal Incapacitation due to  abnormal movements: None, normal Patient's awareness of abnormal movements (rate only patient's report): No Awareness, Dental Status Current problems with teeth and/or dentures?: No Does patient usually wear dentures?: No  CIWA:  CIWA-Ar Total: 1 COWS:  COWS Total Score: 1  Treatment Plan Summary: Review of chart, vital signs, medications, and notes.  1-Individual and group therapy  2-Medication management for depression and anxiety: Medications reviewed with the patient.   -Continue Celexa 20 mg daily for depression -Continue Buspar 7.5 mg BID for anxiety  -Start Trazodone 50 mg hs prn insomnia  3-Coping skills for depression, anxiety  4-Continue crisis stabilization and management  5-Address health issues--monitoring vital signs, stable  6-Treatment plan in progress to prevent relapse of depression and anxiety  Medical Decision Making:  Review of Psycho-Social Stressors (1), Discuss test with performing physician (1), Decision to obtain old records (1), Review and summation of old records (2), New Problem, with no additional work-up planned (3) and Review of New Medication or Change in Dosage (  2)  DAVIS, LAURA, NP-C 06/14/2014, 3:11 PM I agreed with findings and treatment plan of this patient

## 2014-06-14 NOTE — [facility] Group Notes (Signed)
[facility] Group Notes:  (Nursing-Healthy Support System)  Date:  06/14/2014  Time:  0900  Type of Therapy:  Nurse Education  Participation Level:  Did Not Attend   Summary of Progress/Problems: Pt was verbally encouraged and prompted multiple times but still did not attend group.   Ouida SillsWesseh, Lincoln Maxinlivette 06/14/2014, 0900

## 2014-06-14 NOTE — Plan of Care (Signed)
Problem: Alteration in mood & ability to function due to Goal: STG-Patient will comply with prescribed medication regimen (Patient will comply with prescribed medication regimen)  Outcome: Progressing Pt took her medications as ordered. Denied adverse drug reactions when assessed.

## 2014-06-14 NOTE — Progress Notes (Signed)
D: OOB for lunch with peers and back to unit without behavioral issues. Pt reported insomnia from last night; stated she had problem falling and staying asleep. Pt was started on Trazodone 50mg  PO for insomnia. A: Shift assessment done with pt. All medications administered as per MD's orders. Support and availability offered. Pt encouraged to attend scheduled groups as groups are part of her treatment plan and to voice her concerns to writer to address needs. Q 15 minutes checks continues as ordered without incident.  R: Pt receptive to care. Took her medications as ordered. Denied pain, SI, HI and AVH when assessed earlier this shift. Pt attended LCSW group but slept through nursing group. Showered this evening. No behavioral issues to note at present. Safety maintained on / off unit. Continue POC.

## 2014-06-14 NOTE — Progress Notes (Addendum)
Patient has been up and active on the unit, attended group this evening and participated. She requested a prn visteril which she received. Patient currently denies having pain, -si/hi/a/v hall.She reports that she is feeling and doing better. Support and encouragement offered, safety maintained on unit, with 15 min checks.

## 2014-06-14 NOTE — Progress Notes (Signed)
[facility] Group Notes:  (Nursing/MHT/Case Management/Adjunct)  Date:  06/14/2014  Time:  12:22 AM  Type of Therapy:  Group Therapy  Participation Level:  Active  Participation Quality:  Appropriate  Affect:  Appropriate  Cognitive:  Appropriate  Insight:  Appropriate  Engagement in Group:  Engaged  Modes of Intervention:  Socialization and Support   Summary of Progress/Problems: Pt. Was engaged in group discussion.  Pt. Stated she stopped during her healthy activities which lead to her admission.  Pt. Stated she would use coping skills when discharged.  Sondra ComeWilson, Abbegale Stehle J 06/14/2014, 12:22 AM

## 2014-06-14 NOTE — [facility] Group Notes (Signed)
[facility] Group Notes:  (Clinical Social Work)  06/14/2014  10:00-11:00AM  Summary of Progress/Problems:   The main focus of today's process group was to   1)  discuss the importance of adding supports  2)  define health supports versus unhealthy supports  3)  identify the patient's current unhealthy supports and plan how to handle them  4)  Identify the patient's current healthy supports and plan what to add.  An emphasis was placed on using counselor, doctor, therapy groups, 12-step groups, and problem-specific support groups to expand supports.    The patient expressed full comprehension of the concepts presented, and agreed that there is a need to add more supports.  The patient stated that she has no healthy supports, and drugs are her unhealthy support.  She grew up watching her mother on drugs, and was in and out of foster care.  She liked the idea of becoming more familiar with her diagnosis, states that is really what she wants to do.  She said that is the ultimate goal for her hospitalization here, to know what her diagnosis is and what that means, as well as to get on the right medications.  Type of Therapy:  Process Group with Motivational Interviewing  Participation Level:  Active  Participation Quality:  Attentive and Sharing  Affect:  Blunted and Depressed  Cognitive:  Appropriate and Oriented  Insight:  Engaged  Engagement in Therapy:  Engaged  Modes of Intervention:   Education, Support and Processing, Activity  Pilgrim's PrideMareida Grossman-Orr, LCSW 06/14/2014, 12:15pm

## 2014-06-15 NOTE — Plan of Care (Signed)
Problem: Diagnosis: Increased Risk For Suicide Attempt Goal: LTG-Patient Will Report Improved Mood and Deny Suicidal LTG (by discharge) Patient will report improved mood and deny suicidal ideation.  Outcome: Progressing Patient reports that she feels better since being admitted and feels that her medications are helping. She reports that she feels ready to discharge.

## 2014-06-15 NOTE — [facility] Group Notes (Signed)
[facility] LCSW Group Therapy          Overcoming Obstacles       1:15 -2:30        06/15/2014   4:01 PM     Type of Therapy:  Group Therapy  Participation Level:  Appropriate  Participation Quality:  Appropriate  Affect:  Appropriate  Cognitive:Appropriate  Insight: Developing/Improving   Engagement in Therapy: Developing/Imprvoing  Modes of Intervention:  Discussion Exploration  Education Rapport BuildingProblem-Solving Support  Summary of Progress/Problems:  The main focus of today's group was overcoming obstacles.  She advised fear and doubt are the obstacles she has to overcome. Patient able to identify appropriate coping skills including not making excuses for not doing things.   [name], Kristina Guerrero 06/15/2014   4:02 PM

## 2014-06-15 NOTE — Progress Notes (Signed)
D: Patient is alert and oriented. Pt's mood and affect is depressed and pleasant upon interaction. Pt denies SI/HI and AVH. Pt rates depression 3-4/10, hopelessness 3/10, and anxiety 4/10. Pt states her goal for the day is "plans for after discharge." Pt complains of anxiety this afternoon which decreased with PRN medication. Pt is attending some unit groups today. A: Encouragement/Support provided to pt. Active listening by RN. Medication education reviewed with pt. PRN medications administered for anxiety per providers orders (See MAR). Scheduled medications administered per providers orders (See MAR). 15 minute checks continued per protocol for patient safety.  R: Patient cooperative and receptive to nursing interventions. Pt remains safe.

## 2014-06-15 NOTE — [facility] Group Notes (Signed)
[facility]   06/15/2014 9:33 AM    Participation Quality:  Appropraite  Mood/Affect:  Appropriate  Depression Rating:  4  Anxiety Rating:  4  Thoughts of Suicide:  No  Will you contract for safety?   NA  Current AVH:  No  Plan for Discharge/Comments:  Patient attended discharge planning group and actively participated in group. She reports feeling better today and denies SI/HI.  Patient is requesting assistance with outpatient follow up. Suicide prevention education reviewed and SPE document provided.   Transportation Means: Patient has transportation.   Supports:  Patient has a support system.   Shawna Wearing, Joesph JulyQuylle Hairston

## 2014-06-15 NOTE — Plan of Care (Signed)
Problem: Ineffective individual coping Goal: STG: Patient will remain free from self harm Outcome: Progressing Patient remains free from self harm. 15 minute checks continued per protocol for patient safety.   Problem: Diagnosis: Increased Risk For Suicide Attempt Goal: STG-Patient Will Attend All Groups On The Unit Outcome: Progressing Patient is attending unit groups today. Goal: STG-Patient Will Comply With Medication Regime Outcome: Progressing Patient has adhered to medication regimen today with ease.  Problem: Alteration in mood & ability to function due to Goal: LTG-Pt reports reduction in suicidal thoughts (Patient reports reduction in suicidal thoughts and is able to verbalize a safety plan for whenever patient is feeling suicidal)  Outcome: Progressing Patient denies having any suicidal thoughts today.     

## 2014-06-15 NOTE — Progress Notes (Signed)
Patient ID: Kristina Guerrero, female   DOB: 01/14/1991, 23 y.o.   MRN: [id number] [facility] MD Progress Note  06/15/2014 2:31 PM Kristina Guerrero  MRN:  [id number] Subjective:   Patient reports improvement. She is still depressed and anxious but states that she is certainly feeling better compared to her admission. She states she is able now to see the " silver lining" in not having been admitted to [employer] for this semester. States that this gives her time to save more money and become more familiar with [place]. States that a recent unexpected call from a previously estranged sister has helped her feel better and to realize she has more social support than she thought.  Objective:  I have discussed case with treatment team and have met with patient. Overall she is significantly improved compared to her initial presentation. At this time mood is improved and affect is more reactive, and no longer tearful. Also, she is more future oriented and no longer expressing a sense of hopelessness. She does state she remains depressed, but is improved. Of note, the auditory hallucinations she reported upon admission now resolved. She is future oriented at present, and as above, discussed her plans to start college during the fall semester. States wants to become a journalist or writer.  Tolerating medications well at present and no side effects reported. She did not tolerate Seroquel well, which was excessively sedating even at low doses . She is now off Seroquel. No disruptive or agitated behaviors on unit. .   Principal Problem: Major depressive disorder with psychotic features Diagnosis:   Patient Active Problem List   Diagnosis Date Noted  . 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 Suicidal ideations [R45.851]    Total Time spent with patient: 25 minutes    Past Medical History:  Past Medical History  Diagnosis Date  . Depression   . Anxiety    History reviewed. No  pertinent past surgical history. Family History: History reviewed. No pertinent family history. Social History:  History  Alcohol Use  . Yes    Comment: social     History  Drug Use No    History   Social History  . Marital Status: Single    Spouse Name: N/A  . Number of Children: N/A  . Years of Education: N/A   Social History Main Topics  . Smoking status: Current Every Day Smoker -- 1.00 packs/day    Types: Cigarettes  . Smokeless tobacco: Not on file  . Alcohol Use: Yes     Comment: social  . Drug Use: No  . Sexual Activity: Not Currently   Other Topics Concern  . None   Social History Narrative   Additional History:    Sleep:  Improved   Appetite:  Improved    Assessment:   Musculoskeletal: Strength & Muscle Tone: within normal limits Gait & Station: normal Patient leans: N/A   Psychiatric Specialty Exam: Physical Exam  Vitals reviewed. Psychiatric: Her mood appears anxious. She exhibits a depressed mood.    Review of Systems  Constitutional: Negative for fever and chills.  Respiratory: Negative for cough and shortness of breath.   Cardiovascular: Negative for chest pain.  Gastrointestinal: Negative for nausea and vomiting.  Skin: Negative for rash.  Psychiatric/Behavioral: Positive for depression.    Blood pressure 99/51, pulse 92, temperature 97.5 F (36.4 C), temperature source Oral, resp. rate 16, height 5' 3" (1.6 m), weight 145 lb (65.772 kg), last menstrual period 06/07/2014.Body mass index   is 25.69 kg/(m^2).   General Appearance: Casual   Eye Contact:: good   Speech: Clear and Coherent and Normal Rate  Volume: Normal  Mood: less severely depressed, improving  Affect: more reactive   Thought Process: Coherent and Goal Directed  Orientation: Full (Time, Place, and Person)  Thought Content: at this time no hallucinations, no delusions, and not internally preoccupied   Suicidal Thoughts: No, currently denies any self  injurious ideations or SI.   Homicidal Thoughts: No  Memory: Immediate; Good Recent; Good Remote; Good  Judgement: Fair  Insight: Fair  Psychomotor Activity: improved  Concentration: Good  Recall: Good  Fund of Knowledge:Fair  Language: Good  Akathisia: No  Handed: Right  AIMS (if indicated):    Assets: Communication Skills Desire for Improvement Leisure Time Physical Health Resilience Social Support Vocational/Educational  ADL's: Intact  Cognition: WNL  Sleep: Number of Hours: 6.5        Current Medications: Current Facility-Administered Medications  Medication Dose Route Frequency Provider Last Rate Last Dose  . acetaminophen (TYLENOL) tablet 650 mg  650 mg Oral Q6H PRN Ijeoma E Nwaeze, NP      . alum & mag hydroxide-simeth (MAALOX/MYLANTA) 200-200-20 MG/5ML suspension 30 mL  30 mL Oral Q4H PRN Ijeoma E Nwaeze, NP      . busPIRone (BUSPAR) tablet 7.5 mg  7.5 mg Oral BID Sheila Agustin, NP   7.5 mg at 06/15/14 0837  . citalopram (CELEXA) tablet 20 mg  20 mg Oral Daily Laura A Davis, NP   20 mg at 06/15/14 0837  . feeding supplement (ENSURE ENLIVE) (ENSURE ENLIVE) liquid 237 mL  237 mL Oral BID BM Lindsey Baker, RD   237 mL at 06/15/14 1013  . hydrOXYzine (ATARAX/VISTARIL) tablet 25 mg  25 mg Oral TID PRN Ijeoma E Nwaeze, NP   25 mg at 06/14/14 2213  . magnesium hydroxide (MILK OF MAGNESIA) suspension 30 mL  30 mL Oral Daily PRN Ijeoma E Nwaeze, NP      . nicotine (NICODERM CQ - dosed in mg/24 hours) patch 21 mg  21 mg Transdermal Daily Ijeoma E Nwaeze, NP   21 mg at 06/12/14 0800  . traZODone (DESYREL) tablet 50 mg  50 mg Oral QHS PRN Laura A Davis, NP        Lab Results:  No results found for this or any previous visit (from the past 48 hour(s)).  Physical Findings: AIMS: Facial and Oral Movements Muscles of Facial Expression: None, normal Lips and Perioral Area: None, normal Jaw: None, normal Tongue: None, normal,Extremity  Movements Upper (arms, wrists, hands, fingers): None, normal Lower (legs, knees, ankles, toes): None, normal, Trunk Movements Neck, shoulders, hips: None, normal, Overall Severity Severity of abnormal movements (highest score from questions above): None, normal Incapacitation due to abnormal movements: None, normal Patient's awareness of abnormal movements (rate only patient's report): No Awareness, Dental Status Current problems with teeth and/or dentures?: No Does patient usually wear dentures?: No  CIWA:  CIWA-Ar Total: 1 COWS:  COWS Total Score: 1   Assessment- currently improved  Mood and affect, more hopeful, more future oriented, and tolerating current medications well.   Treatment Plan Summary: Continue Celexa  20 mgrs QDAY  Continue Buspar 7.5 mgrs BID   Medical Decision Making:  Established Problem, Stable/Improving (1), Review of Psycho-Social Stressors (1) and Review of Medication Regimen & Side Effects (2)  COBOS, FERNANDO ,MD 06/15/2014, 2:31 PM  

## 2014-06-16 MED ORDER — BUSPIRONE HCL 10 MG PO TABS
10.0000 mg | ORAL_TABLET | Freq: Two times a day (BID) | ORAL | Status: DC
  Administered 2014-06-17: 10 mg via ORAL
  Filled 2014-06-16 (×3): qty 1

## 2014-06-16 NOTE — Progress Notes (Signed)
D: Patient is alert and oriented. Pt's mood and affect is pleasant/euthymic and appropriate to circumstance. Pt denies SI/HI and AVH. Pt rates depression 3/10, hopelessness 2/10, and anxiety 3/10. Pt reports her goal for the day is to work on her "discharge plan." Pt is requesting an increased in her Buspar medication d/t anxiety, pt reports her anxiety was ok yesterday until her visitors arrived. Pt is attending unit groups. Pt complains of anxiety this morning with some relief from PRN medication. A: Encouragement/Support provided to pt. Active listening by RN. MD Cobos made aware of pt's requests. Medication education reviewed with pt. PRN medication administered for anxiety per providers orders (See MAR). Scheduled medications administered per providers orders (See MAR). 15 minute checks continued per protocol for patient safety.  R: Patient cooperative and receptive to nursing interventions. Pt remains safe.

## 2014-06-16 NOTE — Tx Team (Signed)
Interdisciplinary Treatment Plan Update   Date Reviewed:  06/16/2014  Time Reviewed:  8:58 AM  Progress in Treatment:   Attending groups: Yes, patient is attending groups. Participating in groups: Yes, engages in group discussion. Taking medication as prescribed: Yes  Tolerating medication: Yes Family/Significant other contact made:  Yes, collateral contact with roommate. Patient understands diagnosis: Yes, patient understands diagnosis and need for treatment. Discussing patient identified problems/goals with staff: Yes, patient is able to express goals for treatment and discharge. Medical problems stabilized or resolved: Yes Denies suicidal/homicidal ideation: Yes Patient has not harmed self or others: Yes  For review of initial/current patient goals, please see plan of care.  Estimated Length of Stay:  1-2 days  Reasons for Continued Hospitalization:  Anxiety Depression Medication stabilization  New Problems/Goals identified:    Discharge Plan or Barriers:   Home [facility]  Additional Comments:   Continued medication stabilization  Patient and CSW reviewed patient's identified goals and treatment plan.  Patient verbalized understanding and agreed to treatment plan.   Attendees:  Patient:  06/16/2014 8:58 AM   Signature:  Sallyanne HaversF. Cobos, MD 06/16/2014 8:58 AM  Signature: Geoffery LyonsIrving Lugo, MD 06/16/2014 8:58 AM  Signature:  Michaelle BirksBritney Guthrie, RN 06/16/2014 8:58 AM  Signature:   06/16/2014 8:58 AM  Signature:   06/16/2014 8:58 AM  Signature:  Juline PatchQuylle Kaiana Marion, LCSW 06/16/2014 8:58 AM  Signature:  Belenda CruiseKristin Drinkard, LCSW-A 06/16/2014 8:58 AM  Signature:  Porfirio Oareggie King, Care Coordinator [facility] 06/16/2014 8:58 AM  Signature:  Aloha GellKrista Dopson, RN 06/16/2014 8:58 AM  Signature:  06/16/2014  8:58 AM  Signature:   Onnie BoerJennifer Clark, RN [facility] 06/16/2014  8:58 AM  Signature:   06/16/2014  8:58 AM    Scribe for Treatment Team:   Juline PatchQuylle Kaulin Chaves,  06/16/2014  8:58 AM

## 2014-06-16 NOTE — Plan of Care (Signed)
Problem: Alteration in mood & ability to function due to Goal: STG-Patient will attend groups Outcome: Not Progressing Pt did not attend evening group on 06/15/14.

## 2014-06-16 NOTE — [facility] Suicide Risk Assessment (Signed)
[facility] INPATIENT:  Family/Significant Other Suicide Prevention Education  Suicide Prevention Education:  Education Completed; Lauretta ChesterGabby Payne, Roommate, [phone]; has been identified by the patient as the family member/significant other with whom the patient will be residing, and identified as the person(s) who will aid the patient in the event of a mental health crisis (suicidal ideations/suicide attempt).  With written consent from the patient, the family member/significant other has been provided the following suicide prevention education, prior to the and/or following the discharge of the patient.  The suicide prevention education provided includes the following:  Suicide risk factors  Suicide prevention and interventions  National Suicide Hotline telephone number  [facility] assessment telephone number  [facility] Emergency Assistance 911  [facility] and/or Residential Mobile Crisis Unit telephone number  Request made of family/significant other to:  Remove weapons (e.g., guns, rifles, knives), all items previously/currently identified as safety concern.  Roommate advised patient does not have access to weapons.      Remove drugs/medications (over-the-counter, prescriptions, illicit drugs), all items previously/currently identified as a safety concern.  The family member/significant other verbalizes understanding of the suicide prevention education information provided.  The family member/significant other agrees to remove the items of safety concern listed above.  [name], Hawk Mones Hairston 06/16/2014, 9:03 AM

## 2014-06-16 NOTE — Plan of Care (Signed)
Problem: Ineffective individual coping Goal: STG: Patient will remain free from self harm Outcome: Progressing Patient remains free from self harm. 15 minute checks continued per protocol for patient safety.   Problem: Diagnosis: Increased Risk For Suicide Attempt Goal: STG-Patient Will Attend All Groups On The Unit Outcome: Progressing Patient is attending unit groups today. Goal: STG-Patient Will Comply With Medication Regime Outcome: Progressing Patient has adhered to medication regimen today with ease.     

## 2014-06-16 NOTE — Progress Notes (Signed)
Recreation Therapy Notes  Animal-Assisted Activity (AAA) Program Checklist/Progress Notes Patient Eligibility Criteria Checklist & Daily Group note for Rec Tx Intervention  Date: 04.12.2016 Time: 2:45pm [address]    AAA/T Program Assumption of Risk Form signed by [name] yes  Patient is free of allergies or sever asthma yes  Patient reports no fear of animals yes  Patient reports no history of cruelty to animals yes  Patient understands his/her participation is voluntary yes  Patient washes hands before animal contact yes  Patient washes hands after animal contact yes  Behavioral Response: Engaged, Appropriate   Education: Charity fundraiserHand Washing, Appropriate Animal Interaction   Education Outcome: Acknowledges education.   Clinical Observations/Feedback: Patient actively engaged in session, petting therapy dog appropriately from floor level and interacting with peers appropriately.   Kristina Guerrero, LRT/CTRS  Jearl KlinefelterBlanchfield, Kristina Guerrero 06/16/2014 4:28 PM

## 2014-06-16 NOTE — Progress Notes (Signed)
Pt attended spiritual care group on grief and loss facilitated by chaplain Burnis KingfisherMatthew Florita Nitsch. Group opened with brief discussion and psycho-social ed around grief and loss in relationships and in relation to self - identifying life patterns, circumstances, changes that cause losses. Established group norm of speaking from own life experience. Group goal of establishing open and affirming space for members to share loss and experience with grief, normalize grief experience and provide psycho social education and grief support.  Group drew on narrative and Alderian therapeutic modalities.      Kristina Guerrero was present throughout group.  She was engaged with facilitators and other group members, making eye contact and engaging in conversation.  She engaged in conversation around the value of [facility].  This is her first psychiatric admission and she described it as very different than what she had anticipated.  Related feeling upset that she "does not have anyone to turn to."  Spoke with group about feeling like she "needs to be an adult" and "handle things on her own."  Expressed difficulty in trusting people to ask for help.  Lamented with other group members how their experience of culture holds up the ideal of being individualistic.  Belva CromeStalnaker, Arkel Cartwright Wayne MDiv

## 2014-06-16 NOTE — Progress Notes (Signed)
D: Pt has anxious affect and mood.  Pt reports her goal was "not to cry" today and that she achieved her goal.  Pt reports she had a good visit with her roommates.  Pt denies SI/HI, denies hallucinations, denies pain.  Pt has been visible in milieu interacting with peers and staff appropriately.  Pt did not attend evening group.   A: Introduced self to pt.  Met with pt 1:1 and provided support and encouragement.  Actively listened to pt.  PRN medication administered for anxiety. R: Pt is compliant with medications.  Pt verbally contracts for safety.  Will continue to monitor and assess.

## 2014-06-16 NOTE — [facility] Group Notes (Signed)
[facility] LCSW Group Therapy      Feelings About Diagnosis 1:15 - 2:30 PM         06/16/2014 2:34 PM    Type of Therapy:  Group Therapy  Participation Level:  Active  Participation Quality:  Appropriate  Affect:  Appropriate  Cognitive:  Appropriate  Insight:  Developing/Improving  Engagement in Therapy:  Developing/Improving and Engaged  Modes of Intervention:  Discussion, Education, Exploration, Problem-Solving, Rapport Building, Support  Summary of Progress/Problems:  Patient actively participated in group. Patient discussed past and present diagnosis and the effects it has had on  life. She advised of accepting her diagnosis but not always knowing how to handle the symptoms.  Patient shared both of her parents have a mental health diagnosis.   Patient talked about family and society being judgmental and the stigma associated with having a mental health diagnosis.  Kristina Guerrero, Kristina Guerrero 06/16/2014  2:34 PM

## 2014-06-16 NOTE — Progress Notes (Signed)
Patient ID: Kristina Guerrero, female   DOB: [birth year], 24 y.o.   MRN: [id number]  [facility] MD Progress Note  06/16/2014 5:07 PM Kristina Guerrero  MRN:  [id number] Subjective:  Patient states she is feeling better, less depressed, but still not back to her normal status. She states she is still anxious. She is becoming more future oriented, and is focusing on being discharged soon. She states she is slightly drowsy at this time, which may be related to vistaril PRN for anxiety.   Objective:  I have discussed case with treatment team and have met with patient. Patient is going to groups, she is visible in milieu. No disruptive behaviors on unit. She is reporting improvement of mood although still describes some depression. Some anxiety,  But presents calm at this time. Denies medication side effects. We reviewed medication rationales and side effects. We also reviewed her psychiatric history- patient has had brief mood swings, even within minutes or hours, but is not describing any clear history of mania or hypomania. As noted, at this time thinking of discharge and states " I hope I can leave tomorrow if I am feeling better". Responds well to support, empathy,and review of coping skills .  Marland Kitchen   Principal Problem: Major depressive disorder with psychotic features Diagnosis:   Patient Active Problem List   Diagnosis Date Noted  . 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 Suicidal ideations [R45.851]    Total Time spent with patient: 25 minutes    Past Medical History:  Past Medical History  Diagnosis Date  . Depression   . Anxiety    History reviewed. No pertinent past surgical history. Family History: History reviewed. No pertinent family history. Social History:  History  Alcohol Use  . Yes    Comment: social     History  Drug Use No    History   Social History  . Marital Status: Single    Spouse Name: N/A  . Number of Children: N/A  . Years  of Education: N/A   Social History Main Topics  . Smoking status: Current Every Day Smoker -- 1.00 packs/day    Types: Cigarettes  . Smokeless tobacco: Not on file  . Alcohol Use: Yes     Comment: social  . Drug Use: No  . Sexual Activity: Not Currently   Other Topics Concern  . None   Social History Narrative   Additional History:    Sleep:  Improved   Appetite:  Improved    Assessment:   Musculoskeletal: Strength & Muscle Tone: within normal limits Gait & Station: normal Patient leans: N/A   Psychiatric Specialty Exam: Physical Exam  Vitals reviewed. Psychiatric: Her mood appears anxious. She exhibits a depressed mood.    ROS  Blood pressure 115/75, pulse 83, temperature 97.7 F (36.5 C), temperature source Oral, resp. rate 18, height _0  (1.6 m), weight 145 lb (65.772 kg), last menstrual period 06/07/2014.Body mass index is 25.69 kg/(m^2).   General Appearance: Casual   Eye Contact:: good   Speech: Clear and Coherent and Normal Rate  Volume: Normal  Mood: less  Depressed   Affect: more reactive , smiles at times appropriately  Thought Process: Coherent and Goal Directed  Orientation: Full (Time, Place, and Person)  Thought Content: at this time no hallucinations, no delusions, and not internally preoccupied   Suicidal Thoughts: No, currently denies any self injurious ideations or SI.   Homicidal Thoughts: No  Memory: Immediate; Good Recent; Good Remote;  Good  Judgement: improved   Insight: improved   Psychomotor Activity: improved  Concentration: Good  Recall: Good  Fund of Knowledge:Fair  Language: Good  Akathisia: No  Handed: Right  AIMS (if indicated):    Assets: Communication Skills Desire for Improvement Leisure Time Physical Health Resilience Social Support Vocational/Educational  ADL's: Intact  Cognition: WNL  Sleep: Number of Hours: 6.5        Current Medications: Current  Facility-Administered Medications  Medication Dose Route Frequency Provider Last Rate Last Dose  . acetaminophen (TYLENOL) tablet 650 mg  650 mg Oral Q6H PRN Harriet Butte, NP      . alum & mag hydroxide-simeth (MAALOX/MYLANTA) 200-200-20 MG/5ML suspension 30 mL  30 mL Oral Q4H PRN Harriet Butte, NP      . busPIRone (BUSPAR) tablet 7.5 mg  7.5 mg Oral BID Kerrie Buffalo, NP   7.5 mg at 06/16/14 0810  . citalopram (CELEXA) tablet 20 mg  20 mg Oral Daily Niel Hummer, NP   20 mg at 06/16/14 0810  . feeding supplement (ENSURE ENLIVE) (ENSURE ENLIVE) liquid 237 mL  237 mL Oral BID BM Clayton Bibles, RD   237 mL at 06/16/14 1115  . hydrOXYzine (ATARAX/VISTARIL) tablet 25 mg  25 mg Oral TID PRN Harriet Butte, NP   25 mg at 06/16/14 1115  . magnesium hydroxide (MILK OF MAGNESIA) suspension 30 mL  30 mL Oral Daily PRN Harriet Butte, NP      . nicotine (NICODERM CQ - dosed in mg/24 hours) patch 21 mg  21 mg Transdermal Daily Harriet Butte, NP   21 mg at 06/12/14 0800  . traZODone (DESYREL) tablet 50 mg  50 mg Oral QHS PRN Niel Hummer, NP        Lab Results:  No results found for this or any previous visit (from the past 44 hour(s)).  Physical Findings: AIMS: Facial and Oral Movements Muscles of Facial Expression: None, normal Lips and Perioral Area: None, normal Jaw: None, normal Tongue: None, normal,Extremity Movements Upper (arms, wrists, hands, fingers): None, normal Lower (legs, knees, ankles, toes): None, normal, Trunk Movements Neck, shoulders, hips: None, normal, Overall Severity Severity of abnormal movements (highest score from questions above): None, normal Incapacitation due to abnormal movements: None, normal Patient's awareness of abnormal movements (rate only patient's report): No Awareness, Dental Status Current problems with teeth and/or dentures?: No Does patient usually wear dentures?: No  CIWA:  CIWA-Ar Total: 1 COWS:  COWS Total Score: 1   Assessment- continues  to improve gradually, and mood and affect are better than upon admission. At this time participating in groups/milieu and starting to focus on discharge planning .   Treatment Plan Summary: Continue Celexa  20 mgrs QDAY  Increase Buspar  To 10 mgrs BID   Medical Decision Making:  Established Problem, Stable/Improving (1), Review of Psycho-Social Stressors (1) and Review of Medication Regimen & Side Effects (2)  Neita Garnet ,MD 06/16/2014, 5:07 PM

## 2014-06-16 NOTE — [facility] Group Notes (Signed)
[facility] Group Notes:  (Nursing/MHT/Case Management/Adjunct)  Date:  06/16/2014  Time:  0900am  Type of Therapy:  Nurse Education  Participation Level:  Active  Participation Quality:  Appropriate and Attentive  Affect:  Appropriate  Cognitive:  Alert and Appropriate  Insight:  Appropriate and Good  Engagement in Group:  Engaged  Modes of Intervention:  Discussion, Education and Support  Summary of Progress/Problems: Patient attended group, remained engaged and responded appropriately when prompted.  Lendell CapriceGuthrie, Chirstine Defrain A 06/16/2014, 10:55 AM

## 2014-06-16 NOTE — Progress Notes (Signed)
D:Patient in the hallway at the medications window on approach.  Patient states she had a great day.  Patient states she had a phone call from a friend she thought forgot about her but it was a misunderstanding.  Patient states he had felt a lot better today.  Patient denies SI/HI and denies AVH.   A: Staff to monitor Q 15 mins for safety.  Encouragement and support offered.  Scheduled medications administered per orders.  Vistaril administered prn for sleep. R: Patient remains safe on the unit.  Patient attended group tonight.  Patient visible on the unit and interacting with peers.  Patient taking administered medications.

## 2014-06-17 ENCOUNTER — Encounter (HOSPITAL_COMMUNITY): Payer: Self-pay | Admitting: Registered Nurse

## 2014-06-17 DIAGNOSIS — F333 Major depressive disorder, recurrent, severe with psychotic symptoms: Secondary | ICD-10-CM | POA: Insufficient documentation

## 2014-06-17 MED ORDER — TRAZODONE HCL 50 MG PO TABS: 50.0000 mg | ORAL_TABLET | Freq: Every evening | ORAL | Status: DC | PRN

## 2014-06-17 MED ORDER — CITALOPRAM HYDROBROMIDE 20 MG PO TABS: 20.0000 mg | ORAL_TABLET | Freq: Every day | ORAL | Status: AC

## 2014-06-17 MED ORDER — HYDROXYZINE HCL 25 MG PO TABS: 25.0000 mg | ORAL_TABLET | Freq: Three times a day (TID) | ORAL | Status: DC | PRN

## 2014-06-17 MED ORDER — BUSPIRONE HCL 10 MG PO TABS: 10.0000 mg | ORAL_TABLET | Freq: Two times a day (BID) | ORAL | Status: DC

## 2014-06-17 NOTE — Progress Notes (Signed)
Patient verbalizes readiness for discharge. Teaching explained, Rx's given along with sample meds. Belongings returned. Patient verbalizes understanding. Denies SI/HI and is discharged to friends in stable condition. Lawrence MarseillesFriedman, Toriano Aikey Eakes

## 2014-06-17 NOTE — Progress Notes (Addendum)
  [facility] Discharge [facility] :  Will you be returning to the same living situation after discharge:  Yes,  Patient will be returning to previous living arrangement. At discharge, do you have transportation home?: Yes,  Patient will arrange transportation home. Do you have the ability to pay for your medications: No.   Patient will need assistance with indigent medications.  Release of information consent forms completed, signed, and in the chart.  Patient to Follow up at: Follow-up Information    [facility] On [facility]19/2014.   Specialty:  Behavioral Health   Why:  [facility] on Friday, June 19, 2014 or any weekday between 8 am to 3 pm for assessment for medication management services.   Contact informationMike Craze:   [address] [place] [address] [phone]       Follow up with [facility] On 06/23/2014.   Why:  You are scheduled with [facility] on Tuesday, June 23, 2014 at 10:30 AM   Contact information:   [address] [place] [place], [place] [address] [phone]       Patient denies SI/HI: Patient no longer endorsing SI/HI or other thoughts of self harm.   Safety Planning and Suicide Prevention discussed: .Reviewed with all patients during discharge planning group   Have you used any form of tobacco in the last 30 days? (Cigarettes, Smokeless Tobacco, Cigars, and/or Pipes): Yes  Has patient been referred to the Quitline?: Quitline referral made on 06/13/14.  Kristina BankerHodnett, Kristina Guerrero 06/17/2014, 10:06 AM

## 2014-06-17 NOTE — [facility] Group Notes (Signed)
[facility] Note   06/17/2014 10:03 AM    Participation Quality:  Appropraite  Mood/Affect:  Appropriate  Depression Rating:  3  Anxiety Rating:  4  Thoughts of Suicide:  No  Will you contract for safety?   NA  Current AVH:  No  Plan for Discharge/Comments:  Patient attended discharge planning group and actively participated in group. She reports doing well and hopes to discharge soon.  She expression apprehension about returning to a negative environment at discharge.  Patient encouraged to take advantage of support groups through [facility]. [facility].  Suicide prevention education reviewed and SPE document provided.   Transportation Means: Patient has transportation.   Supports:  Patient has a support system.   Alixis Harmon, Joesph JulyQuylle Hairston

## 2014-06-17 NOTE — Progress Notes (Signed)
Patient resting in bed this morning but promptly came up for AM meds. Some anxiety noted. States she thinks she is discharging today and says, "I'm ready to go but I just don't know if I'm ready to deal with everything. But I know it's time." When asked about her support system she replies, "we are working on it." Patient medicated per orders. Support and reassurance given. She denies SI/HI and remains safe. Lawrence MarseillesFriedman, Jozsef Wescoat Eakes
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
## 2014-06-17 NOTE — [facility] Suicide Risk Assessment (Signed)
[facility]   Demographic Factors:  24 year old single female, employed, live with roommates  Total Time spent with patient: 30 minutes  Musculoskeletal: Strength & Muscle Tone: within normal limits Gait & Station: normal Patient leans: N/A  Psychiatric Specialty Exam: Physical Exam  ROS  Blood pressure 102/71, pulse 100, temperature 98.3 F (36.8 C), temperature source Oral, resp. rate 16, height 5\' 3"  (1.6 m), weight 145 lb (65.772 kg), last menstrual period 06/07/2014.Body mass index is 25.69 kg/(m^2).  General Appearance: improved grooming   Eye Contact::  Good  Speech:  Normal Rate409  Volume:  Normal  Mood:  improved, and at this time euthymic, affect more reactive   Affect:  Appropriate and Full Range  Thought Process:  Goal Directed and Linear  Orientation:  Full (Time, Place, and Person)  Thought Content:  no hallucinations,no delusions   Suicidal Thoughts:  No- at this time denies any plans or intentions of hurting self.  Homicidal Thoughts:  No  Memory:  recent and remote grossly intact   Judgement:  Other:  improved   Insight:  Present  Psychomotor Activity:  Normal  Concentration:  Good  Recall:  Good  Fund of Knowledge:Good  Language: Good  Akathisia:  Negative  Handed:  Right  AIMS (if indicated):     Assets:  Communication Skills Desire for Improvement Physical Health Resilience  Sleep:  Number of Hours: 5.75  Cognition: WNL  ADL's:  Intact   Have you used any form of tobacco in the last 30 days? (Cigarettes, Smokeless Tobacco, Cigars, and/or Pipes): Yes  Has this patient used any form of tobacco in the last 30 days? (Cigarettes, Smokeless Tobacco, Cigars, and/or Pipes) Yes, A prescription for an FDA-approved tobacco cessation medication was offered at discharge and the patient refused  Mental Status Per Nursing Assessment::   On Admission:  Suicidal ideation indicated by patient  Current Mental Status by Physician: At  this time patient is much improved compared to admission, with an improved mood, improved range of affect, no thought disorder, no suicidal  Thoughts , no homicidal thoughts, and no psychotic symptoms. She is future oriented and more hopeful and optimistic about her future .  Loss Factors: Recent relocation, limited support network locally, unable to get into college this semester due to some financial aid "bureaucratic" factor.  Recent break up.   Historical Factors: History of depression, history of suicide attempt in the past by OD   Risk Reduction Factors:   Sense of responsibility to family, Living with another person, especially a relative and Positive coping skills or problem solving skills  Continued Clinical Symptoms:  As noted, much improved mood and affect .  Cognitive Features That Contribute To Risk:  No gross cognitive deficits noted upon discharge. Is alert , attentive, and oriented x 3   Suicide Risk:  Mild:  Suicidal ideation of limited frequency, intensity, duration, and specificity.  There are no identifiable plans, no associated intent, mild dysphoria and related symptoms, good self-control (both objective and subjective assessment), few other risk factors, and identifiable protective factors, including available and accessible social support.  Principal Problem: Major depressive disorder with psychotic features Discharge Diagnoses:  Patient Active Problem List   Diagnosis Date Noted  . 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 Suicidal ideations [R45.851]     Follow-up Information    [facility].   Specialty:  Behavioral Health   Why:  Please go to  [facility] walk-in clinic on Wednesday, June 17, 2014 or any weekday between 8 am to 3 pm for assessment for medication management services.   Contact informationMike Craze [place] [address] [phone]       Follow up with [facility] On 06/23/2014.   Why:  You are scheduled with [facility] Hacker on Tuesday, June 13, 2014 at 10:30 AM   Contact information:   [address] [place], [place] [address] [phone]       Plan Of Care/Follow-up recommendations:  Activity:  as tolerated Diet:  Regular Tests:  NA Other:  See below  Is patient on multiple antipsychotic therapies at discharge:  No   Has Patient had three or more failed trials of antipsychotic monotherapy by history:  No  Recommended Plan for Multiple Antipsychotic Therapies: NA   Patient is leaving in good spirits. Plans to follow up as above.    COBOS, FERNANDO 06/17/2014, 9:31 AM

## 2014-06-19 NOTE — Progress Notes (Signed)
Patient Discharge Instructions:  After Visit Summary (AVS):   Faxed to:  06/19/14 Discharge Summary Note:   Faxed to:  06/19/14 Psychiatric Admission Assessment Note:   Faxed to:  06/19/14 Suicide Risk Assessment - Discharge Assessment:   Faxed to:  06/19/14 Faxed/Sent to the Next Level Care provider:  06/19/14 [facility] @ [phone] Faxed to [facility] @ [phone]  Jerelene ReddenSheena E Monroe, 06/19/2014, 3:38 PM

## 2014-10-13 ENCOUNTER — Ambulatory Visit (INDEPENDENT_AMBULATORY_CARE_PROVIDER_SITE_OTHER): Payer: BLUE CROSS/BLUE SHIELD | Admitting: Family Medicine

## 2014-10-13 VITALS — BP 116/72 | HR 87 | Temp 97.8°F | Resp 18 | Ht 64.0 in | Wt 153.0 lb

## 2014-10-13 DIAGNOSIS — Z7189 Other specified counseling: Secondary | ICD-10-CM | POA: Diagnosis not present

## 2014-10-13 DIAGNOSIS — Z7185 Encounter for immunization safety counseling: Secondary | ICD-10-CM

## 2014-10-13 NOTE — Progress Notes (Signed)
Subjective:  This chart was scribed for Merri Ray, MD by [facility], medical scribe at [facility].The patient was seen in exam room 05 and the patient's care was started at 5:14 PM.   Patient ID: Kristina Guerrero, female    DOB: Jul 11, 1990, 24 y.o.   MRN: [id number] Chief Complaint  Patient presents with  . Immunizations    for college    HPI HPI Comments: Kristina Guerrero is a 24 y.o. female who presents to [facility] for proof of immunizations for college. No immunizations listed in [facility] and on [facility] she has a TDAP listed on 08/09 at [employer]. 2 MMR August 95, March 1999, 5 DTPs, most recent August 1999 but T-dap UTD, 5 IPV most recent August 1999. Hep B x3 most recent March 1995. 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 No recent fever, and chill  Patient Active Problem List   Diagnosis Date Noted  . Severe recurrent major depressive disorder with psychotic features   . Major depressive disorder with psychotic features 06/11/2014  . Suicidal ideations    Past Medical History  Diagnosis Date  . Depression   . Anxiety    History reviewed. No pertinent past surgical history. Allergies  Allergen Reactions  . Penicillins    Prior to Admission medications   Medication Sig Start Date End Date Taking? Authorizing Provider  busPIRone (BUSPAR) 10 MG tablet Take 1 tablet (10 mg total) by mouth 2 (two) times daily. For anxiety 06/17/14  Yes Shuvon B Rankin, NP  citalopram (CELEXA) 20 MG tablet Take 1 tablet (20 mg total) by mouth daily. For depression 06/17/14  Yes Shuvon B Rankin, NP  hydrOXYzine (ATARAX/VISTARIL) 25 MG tablet Take 1 tablet (25 mg total) by mouth 3 (three) times daily as needed for anxiety (sleep). 06/17/14  Yes Shuvon B Rankin, NP  traZODone (DESYREL) 50 MG tablet Take 1 tablet (50 mg total) by mouth at bedtime as needed for sleep. 06/17/14  Yes Shuvon B Rankin, NP   History   Social History  . Marital Status:  Single    Spouse Name: N/A  . Number of Children: N/A  . Years of Education: N/A   Occupational History  . Not on file.   Social History Main Topics  . Smoking status: Current Every Day Smoker -- 1.00 packs/day    Types: Cigarettes  . Smokeless tobacco: Not on file  . Alcohol Use: 0.6 - 1.2 oz/week    1-2 Standard drinks or equivalent per week     Comment: social  . Drug Use: No  . Sexual Activity: Not Currently   Other Topics Concern  . Not on file   Social History Narrative   Review of Systems  Constitutional: Negative for fever and chills.       Objective:  BP 116/72 mmHg  Pulse 87  Temp(Src) 97.8 F (36.6 C) (Oral)  Resp 18  Ht 5' 4"  (1.626 m)  Wt 153 lb (69.4 kg)  BMI 26.25 kg/m2  SpO2 98%  LMP 09/30/2014 Physical Exam  Constitutional: She is oriented to person, place, and time. She appears well-developed and well-nourished. No distress.  HENT:  Head: Normocephalic and atraumatic.  Eyes: Pupils are equal, round, and reactive to light.  Neck: Normal range of motion.  Cardiovascular: Normal rate and regular rhythm.   Pulmonary/Chest: Effort normal. No respiratory distress.  Musculoskeletal: Normal range of motion.  Neurological: She is alert and oriented to person,  place, and time.  Skin: Skin is warm and dry.  Psychiatric: She has a normal mood and affect. Her behavior is normal.  Nursing note and vitals reviewed.     Assessment & Plan:   Kristina Guerrero is a 24 y.o. female Immunization counseling  -Up-to-date on Tdap as of today, has had hep B vaccination, polio vaccination,  as well as measles mumps rubella vaccination. Recommended HPV and meningitis vaccination, but she would like to check with her insurance for coverage at this time.  Letter printed for her school, and handout given with recommendations. Also advised flu vaccine this fall.  If she does require Varicella documentation, can place lab only order for Varicella IgG. She is to call let me  know if this is needed.  No orders of the defined types were placed in this encounter.   Patient Instructions  To like school this fall. I completed a letter and will fax that to [employer].  See information below on the HPV and meningitis vaccines. If you would like to have these given, return here or student health at your school. You can call your insurance company to determine if these are covered.  I also recommend a flu vaccine once those are available this fall. Please let me know if you have any questions.   Meningococcal Vaccines: What You Need to Know 1. What is meningococcal disease? Meningococcal disease is a serious bacterial illness. It is a leading cause of bacterial meningitis in children 2 through 4 years old in the [place]. Meningitis is an infection of the covering of the brain and the spinal cord. Meningococcal disease also causes blood infections. About 1,000-1,200 people get meningococcal disease each year in the [place] Even when they are treated with antibiotics, 10-15% of these people die. Of those who live, another 11%-19% lose their arms or legs, have problems with their nervous systems, become deaf, or suffer seizures or strokes. Anyone can get meningococcal disease. But it is most common in infants less than one year of age and people 16-21 years. Children with certain medical conditions, such as lack of a spleen, have an increased risk of getting meningococcal disease. College freshmen living in [place] are also at increased risk. Meningococcal infections can be treated with drugs such as penicillin. Still, many people who get the disease die from it, and many others are affected for life. This is why preventing the disease through use of meningococcal vaccine is important for people at highest risk. 2. Meningococcal vaccine There are two kinds of meningococcal vaccine in the [place]  Meningococcal conjugate vaccine (MCV4) is the preferred vaccine for people 39 years of age  and younger.  Meningococcal polysaccharide vaccine (MPSV4) has been available since the 1970s. It is the only meningococcal vaccine licensed for people older than 12. Both vaccines can prevent 4 types of meningococcal disease, including 2 of the 3 types most common in the [place] and a type that causes epidemics in [place]. There are other types of meningococcal disease; the vaccines do not protect against these.  3. Who should get meningococcal vaccine and when? Routine vaccination Two doses of MCV4 are recommended for adolescents 11 through 23 years of age: the first dose at 110 or 23 years of age, with a booster dose at age 73. Adolescents in this age group with HIV infection should get 3 doses: 2 doses 2 months apart at 20 or 12 years, plus a booster at age 65. If the first dose (or series)  is given between 68 and 48 years of age, the booster should be given between 8 and 15. If the first dose (or series) is given after the 16th birthday, a booster is not needed. Other people at increased risk  College freshmen living in dormitories.  Laboratory personnel who are routinely exposed to meningococcal bacteria.  [place] recruits.  Anyone traveling to, or living in, a part of the world where meningococcal disease is common, such as parts of [place].  Anyone who has a damaged spleen, or whose spleen has been removed.  Anyone who has persistent complement component deficiency (an immune system disorder).  People who might have been exposed to meningitis during an outbreak. Children between 47 and 77 months of age, and anyone else with certain medical conditions need 2 doses for adequate protection. Ask your doctor about the number and timing of doses, and the need for booster doses. MCV4 is the preferred vaccine for people in these groups who are 9 months through 23 years of age. MPSV4 can be used for adults older than 55. 4. Some people should not get meningococcal vaccine or should  wait.  Anyone who has ever had a severe (life-threatening) allergic reaction to a previous dose of MCV4 or MPSV4 vaccine should not get another dose of either vaccine.  Anyone who has a severe (life threatening) allergy to any vaccine component should not get the vaccine. Tell your doctor if you have any severe allergies.  Anyone who is moderately or severely ill at the time the shot is scheduled should probably wait until they recover. Ask your doctor. People with a mild illness can usually get the vaccine.  Meningococcal vaccines may be given to pregnant women. MCV4 is a fairly new vaccine and has not been studied in pregnant women as much as MPSV4 has. It should be used only if clearly needed. The manufacturers of MCV4 maintain pregnancy registries for women who are vaccinated while pregnant. Except for children with sickle cell disease or without a working spleen, meningococcal vaccines may be given at the same time as other vaccines. 5. What are the risks from meningococcal vaccines? A vaccine, like any medicine, could possibly cause serious problems, such as severe allergic reactions. The risk of meningococcal vaccine causing serious harm, or death, is extremely small. Brief fainting spells and related symptoms (such as jerking or seizure-like movements) can follow a vaccination. They happen most often with adolescents, and they can result in falls and injuries. Sitting or lying down for about 15 minutes after getting the shot--especially if you feel faint--can help prevent these injuries. Mild problems As many as half the people who get meningococcal vaccines have mild side effects, such as redness or pain where the shot was given. If these problems occur, they usually last for 1 or 2 days. They are more common after MCV4 than after MPSV4. A small percentage of people who receive the vaccine develop a mild fever. Severe problems Serious allergic reactions, within a few minutes to a few  hours of the shot, are very rare. 6. What if there is a serious reaction? What should I look for? Look for anything that concerns you, such as signs of a severe allergic reaction, very high fever, or behavior changes. Signs of a severe allergic reaction can include hives, swelling of the face and throat, difficulty breathing, a fast heartbeat, dizziness, and weakness. These would start a few minutes to a few hours after the vaccination. What should I do?  If  you think it is a severe allergic reaction or other emergency that can't wait, call 9-1-1 or get the person to the nearest hospital. Otherwise, call your doctor.  Afterward, the reaction should be reported to the Vaccine Adverse Event Reporting System (VAERS). Your doctor might file this report, or you can do it yourself through the VAERS web site at www.vaers.SamedayNews.es, or by calling (346)367-8040. VAERS is only for reporting reactions. They do not give medical advice. 7. The National Vaccine Injury Compensation Program The [employer] Vaccine Injury Compensation Program (VICP) is a federal program that was created to compensate people who may have been injured by certain vaccines. Persons who believe they may have been injured by a vaccine can learn about the program and about filing a claim by calling 5798769183 or visiting the [place] website at GoldCloset.com.ee. 8. How can I learn more?  Ask your doctor.  Call your local or state health department.  Contact the Centers for Disease Control and Prevention (CDC):  Call 9250968194 (1-800-CDC-INFO) or  Visit the CDC's website at http://hunter.com/ CDC Meningococcal Vaccine (Interim) VIS (12/17/2009) Document Released: 12/18/2005 Document Revised: 07/07/2013 Document Reviewed: 06/12/2012 [facility] Patient Information 2015 [place]. This information is not intended to replace advice given to you by your health care provider. Make sure you discuss any questions  you have with your health care provider.   HPV Vaccine Gardasil (Human Papillomavirus): What You Need to Know 1. What is HPV? Genital human papillomavirus (HPV) is the most common sexually transmitted virus in the [place]. More than half of sexually active men and women are infected with HPV at some time in their lives. About 20 million Americans are currently infected, and about 6 million more get infected each year. HPV is usually spread through sexual contact. Most HPV infections don't cause any symptoms, and go away on their own. But HPV can cause cervical cancer in women. Cervical cancer is the 2nd leading cause of cancer deaths among women around the world. In the [place], about 12,000 women get cervical cancer every year and about 4,000 are expected to die from it. HPV is also associated with several less common cancers, such as vaginal and vulvar cancers in women, and anal and oropharyngeal (back of the throat, including base of tongue and tonsils) cancers in both men and women. HPV can also cause genital warts and warts in the throat. There is no cure for HPV infection, but some of the problems it causes can be treated. 2. HPV vaccine: Why get vaccinated? The HPV vaccine you are getting is one of two vaccines that can be given to prevent HPV. It may be given to both males and females.  This vaccine can prevent most cases of cervical cancer in females, if it is given before exposure to the virus. In addition, it can prevent vaginal and vulvar cancer in females, and genital warts and anal cancer in both males and females. Protection from HPV vaccine is expected to be long-lasting. But vaccination is not a substitute for cervical cancer screening. Women should still get regular Pap tests. 3. Who should get this HPV vaccine and when? HPV vaccine is given as a 3-dose series  1st Dose: Now  2nd Dose: 1 to 2 months after Dose 1  3rd Dose: 6 months after Dose 1 Additional  (booster) doses are not recommended. Routine vaccination  This HPV vaccine is recommended for girls and boys 92 or 23 years of age. It may be given starting at age  9. Why is HPV vaccine recommended at 40 or 23 years of age?  HPV infection is easily acquired, even with only one sex partner. That is why it is important to get HPV vaccine before any sexual contact takes place. Also, response to the vaccine is better at this age than at older ages. Catch-up vaccination This vaccine is recommended for the following people who have not completed the 3-dose series:   Females 31 through 23 years of age.  Males 70 through 23 years of age. This vaccine may be given to men 13 through 23 years of age who have not completed the 3-dose series. It is recommended for men through age 24 who have sex with men or whose immune system is weakened because of HIV infection, other illness, or medications.  HPV vaccine may be given at the same time as other vaccines. 4. Some people should not get HPV vaccine or should wait.  Anyone who has ever had a life-threatening allergic reaction to any component of HPV vaccine, or to a previous dose of HPV vaccine, should not get the vaccine. Tell your doctor if the person getting vaccinated has any severe allergies, including an allergy to yeast.  HPV vaccine is not recommended for pregnant women. However, receiving HPV vaccine when pregnant is not a reason to consider terminating the pregnancy. Women who are breast feeding may get the vaccine.  People who are mildly ill when a dose of HPV is planned can still be vaccinated. People with a moderate or severe illness should wait until they are better. 5. What are the risks from this vaccine? This HPV vaccine has been used in the [place] and around the world for about six years and has been very safe. However, any medicine could possibly cause a serious problem, such as a severe allergic reaction. The risk of any vaccine causing a  serious injury, or death, is extremely small. Life-threatening allergic reactions from vaccines are very rare. If they do occur, it would be within a few minutes to a few hours after the vaccination. Several mild to moderate problems are known to occur with this HPV vaccine. These do not last long and go away on their own.  Reactions in the arm where the shot was given:  Pain (about 8 people in 10)  Redness or swelling (about 1 person in 4)  Fever:  Mild (100 F) (about 1 person in 10)  Moderate (102 F) (about 1 person in 38)  Other problems:  Headache (about 1 person in 3)  Fainting: Brief fainting spells and related symptoms (such as jerking movements) can happen after any medical procedure, including vaccination. Sitting or lying down for about 15 minutes after a vaccination can help prevent fainting and injuries caused by falls. Tell your doctor if the patient feels dizzy or light-headed, or has vision changes or ringing in the ears.  Like all vaccines, HPV vaccines will continue to be monitored for unusual or severe problems. 6. What if there is a serious reaction? What should I look for?  Look for anything that concerns you, such as signs of a severe allergic reaction, very high fever, or behavior changes. Signs of a severe allergic reaction can include hives, swelling of the face and throat, difficulty breathing, a fast heartbeat, dizziness, and weakness. These would start a few minutes to a few hours after the vaccination.  What should I do?  If you think it is a severe allergic reaction or other emergency that can't  wait, call 9-1-1 or get the person to the nearest hospital. Otherwise, call your doctor.  Afterward, the reaction should be reported to the Vaccine Adverse Event Reporting System (VAERS). Your doctor might file this report, or you can do it yourself through the VAERS web site at www.vaers.SamedayNews.es, or by calling [phone]. VAERS is only for reporting  reactions. They do not give medical advice. 7. The National Vaccine Injury Compensation Program  The [employer] Vaccine Injury Compensation Program (VICP) is a federal program that was created to compensate people who may have been injured by certain vaccines.  Persons who believe they may have been injured by a vaccine can learn about the program and about filing a claim by calling 5643952452 or visiting the [place] website at GoldCloset.com.ee. 8. How can I learn more?  Ask your doctor.  Call your local or state health department.  Contact the Centers for Disease Control and Prevention (CDC):  Call 508 864 8375 (1-800-CDC-INFO)  or  Visit CDC's website at http://hunter.com/ CDC Human Papillomavirus (HPV) Gardasil (Interim) 07/21/11 Document Released: 12/18/2005 Document Revised: 07/07/2013 Document Reviewed: 04/03/2013 [employer] Patient Information 2015 [place], [place]. This information is not intended to replace advice given to you by your health care provider. Make sure you discuss any questions you have with your health care provider.     I personally performed the services described in this documentation, which was scribed in my presence. The recorded information has been reviewed and considered, and addended by me as needed.

## 2014-10-13 NOTE — Patient Instructions (Signed)
To like school this fall. I completed a letter and will fax that to [facility].  See information below on the HPV and meningitis vaccines. If you would like to have these given, return here or student health at your school. You can call your insurance company to determine if these are covered.  I also recommend a flu vaccine once those are available this fall. Please let me know if you have any questions.   Meningococcal Vaccines: What You Need to Know 1. What is meningococcal disease? Meningococcal disease is a serious bacterial illness. It is a leading cause of bacterial meningitis in children 2 through 15 years old in the [place]. Meningitis is an infection of the covering of the brain and the spinal cord. Meningococcal disease also causes blood infections. About 1,000-1,200 people get meningococcal disease each year in the [place] Even when they are treated with antibiotics, 10-15% of these people die. Of those who live, another 11%-19% lose their arms or legs, have problems with their nervous systems, become deaf, or suffer seizures or strokes. Anyone can get meningococcal disease. But it is most common in infants less than one year of age and people 16-21 years. Children with certain medical conditions, such as lack of a spleen, have an increased risk of getting meningococcal disease. College freshmen living in dorms are also at increased risk. Meningococcal infections can be treated with drugs such as penicillin. Still, many people who get the disease die from it, and many others are affected for life. This is why preventing the disease through use of meningococcal vaccine is important for people at highest risk. 2. Meningococcal vaccine There are two kinds of meningococcal vaccine in the [place]  Meningococcal conjugate vaccine (MCV4) is the preferred vaccine for people 19 years of age and younger.  Meningococcal polysaccharide vaccine (MPSV4) has been available since the 1970s. It is the only  meningococcal vaccine licensed for people older than 55. Both vaccines can prevent 4 types of meningococcal disease, including 2 of the 3 types most common in the [place] and a type that causes epidemics in [place]. There are other types of meningococcal disease; the vaccines do not protect against these.  3. Who should get meningococcal vaccine and when? Routine vaccination Two doses of MCV4 are recommended for adolescents 11 through 23 years of age: the first dose at 67 or 23 years of age, with a booster dose at age 60. Adolescents in this age group with HIV infection should get 3 doses: 2 doses 2 months apart at 79 or 12 years, plus a booster at age 14. If the first dose (or series) is given between 50 and 28 years of age, the booster should be given between 54 and 26. If the first dose (or series) is given after the 16th birthday, a booster is not needed. Other people at increased risk  College freshmen living in dormitories.  Laboratory personnel who are routinely exposed to meningococcal bacteria.  [employer] recruits.  Anyone traveling to, or living in, a part of the world where meningococcal disease is common, such as parts of [place].  Anyone who has a damaged spleen, or whose spleen has been removed.  Anyone who has persistent complement component deficiency (an immune system disorder).  People who might have been exposed to meningitis during an outbreak. Children between 68 and 55 months of age, and anyone else with certain medical conditions need 2 doses for adequate protection. Ask your doctor about the number and timing of  doses, and the need for booster doses. MCV4 is the preferred vaccine for people in these groups who are 9 months through 23 years of age. MPSV4 can be used for adults older than 55. 4. Some people should not get meningococcal vaccine or should wait.  Anyone who has ever had a severe (life-threatening) allergic reaction to a previous dose of MCV4 or  MPSV4 vaccine should not get another dose of either vaccine.  Anyone who has a severe (life threatening) allergy to any vaccine component should not get the vaccine. Tell your doctor if you have any severe allergies.  Anyone who is moderately or severely ill at the time the shot is scheduled should probably wait until they recover. Ask your doctor. People with a mild illness can usually get the vaccine.  Meningococcal vaccines may be given to pregnant women. MCV4 is a fairly new vaccine and has not been studied in pregnant women as much as MPSV4 has. It should be used only if clearly needed. The manufacturers of MCV4 maintain pregnancy registries for women who are vaccinated while pregnant. Except for children with sickle cell disease or without a working spleen, meningococcal vaccines may be given at the same time as other vaccines. 5. What are the risks from meningococcal vaccines? A vaccine, like any medicine, could possibly cause serious problems, such as severe allergic reactions. The risk of meningococcal vaccine causing serious harm, or death, is extremely small. Brief fainting spells and related symptoms (such as jerking or seizure-like movements) can follow a vaccination. They happen most often with adolescents, and they can result in falls and injuries. Sitting or lying down for about 15 minutes after getting the shot--especially if you feel faint--can help prevent these injuries. Mild problems As many as half the people who get meningococcal vaccines have mild side effects, such as redness or pain where the shot was given. If these problems occur, they usually last for 1 or 2 days. They are more common after MCV4 than after MPSV4. A small percentage of people who receive the vaccine develop a mild fever. Severe problems Serious allergic reactions, within a few minutes to a few hours of the shot, are very rare. 6. What if there is a serious reaction? What should I look for? Look for  anything that concerns you, such as signs of a severe allergic reaction, very high fever, or behavior changes. Signs of a severe allergic reaction can include hives, swelling of the face and throat, difficulty breathing, a fast heartbeat, dizziness, and weakness. These would start a few minutes to a few hours after the vaccination. What should I do?  If you think it is a severe allergic reaction or other emergency that can't wait, call 9-1-1 or get the person to the nearest hospital. Otherwise, call your doctor.  Afterward, the reaction should be reported to the Vaccine Adverse Event Reporting System (VAERS). Your doctor might file this report, or you can do it yourself through the VAERS web site at www.vaers.LAgents.no, or by calling 1-305-824-5157. VAERS is only for reporting reactions. They do not give medical advice. 7. The National Vaccine Injury Compensation Program The [employer] Vaccine Injury Compensation Program (VICP) is a federal program that was created to compensate people who may have been injured by certain vaccines. Persons who believe they may have been injured by a vaccine can learn about the program and about filing a claim by calling 1-(914)146-2752 or visiting the VICP website at SpiritualWord.at. 8. How can I learn more?  Ask  your doctor.  Call your local or state health department.  Contact the Centers for Disease Control and Prevention (CDC):  Call 707 698 6584 (1-800-CDC-INFO) or  Visit the CDC's website at PicCapture.uy CDC Meningococcal Vaccine (Interim) VIS (12/17/2009) Document Released: 12/18/2005 Document Revised: 07/07/2013 Document Reviewed: 06/12/2012 [facility] Patient Information 2015 [place], [place]. This information is not intended to replace advice given to you by your health care provider. Make sure you discuss any questions you have with your health care provider.   HPV Vaccine Gardasil (Human Papillomavirus): What You Need to  Know 1. What is HPV? Genital human papillomavirus (HPV) is the most common sexually transmitted virus in the [place]. More than half of sexually active men and women are infected with HPV at some time in their lives. About 20 million Americans are currently infected, and about 6 million more get infected each year. HPV is usually spread through sexual contact. Most HPV infections don't cause any symptoms, and go away on their own. But HPV can cause cervical cancer in women. Cervical cancer is the 2nd leading cause of cancer deaths among women around the world. In the [place], about 12,000 women get cervical cancer every year and about 4,000 are expected to die from it. HPV is also associated with several less common cancers, such as vaginal and vulvar cancers in women, and anal and oropharyngeal (back of the throat, including base of tongue and tonsils) cancers in both men and women. HPV can also cause genital warts and warts in the throat. There is no cure for HPV infection, but some of the problems it causes can be treated. 2. HPV vaccine: Why get vaccinated? The HPV vaccine you are getting is one of two vaccines that can be given to prevent HPV. It may be given to both males and females.  This vaccine can prevent most cases of cervical cancer in females, if it is given before exposure to the virus. In addition, it can prevent vaginal and vulvar cancer in females, and genital warts and anal cancer in both males and females. Protection from HPV vaccine is expected to be long-lasting. But vaccination is not a substitute for cervical cancer screening. Women should still get regular Pap tests. 3. Who should get this HPV vaccine and when? HPV vaccine is given as a 3-dose series  1st Dose: Now  2nd Dose: 1 to 2 months after Dose 1  3rd Dose: 6 months after Dose 1 Additional (booster) doses are not recommended. Routine vaccination  This HPV vaccine is recommended for girls and boys 107 or  23 years of age. It may be given starting at age 10. Why is HPV vaccine recommended at 79 or 23 years of age?  HPV infection is easily acquired, even with only one sex partner. That is why it is important to get HPV vaccine before any sexual contact takes place. Also, response to the vaccine is better at this age than at older ages. Catch-up vaccination This vaccine is recommended for the following people who have not completed the 3-dose series:   Females 13 through 23 years of age.  Males 13 through 23 years of age. This vaccine may be given to men 22 through 23 years of age who have not completed the 3-dose series. It is recommended for men through age 59 who have sex with men or whose immune system is weakened because of HIV infection, other illness, or medications.  HPV vaccine may be given at the same time as other vaccines.  4. Some people should not get HPV vaccine or should wait.  Anyone who has ever had a life-threatening allergic reaction to any component of HPV vaccine, or to a previous dose of HPV vaccine, should not get the vaccine. Tell your doctor if the person getting vaccinated has any severe allergies, including an allergy to yeast.  HPV vaccine is not recommended for pregnant women. However, receiving HPV vaccine when pregnant is not a reason to consider terminating the pregnancy. Women who are breast feeding may get the vaccine.  People who are mildly ill when a dose of HPV is planned can still be vaccinated. People with a moderate or severe illness should wait until they are better. 5. What are the risks from this vaccine? This HPV vaccine has been used in the [place] and around the world for about six years and has been very safe. However, any medicine could possibly cause a serious problem, such as a severe allergic reaction. The risk of any vaccine causing a serious injury, or death, is extremely small. Life-threatening allergic reactions from vaccines are very rare. If they  do occur, it would be within a few minutes to a few hours after the vaccination. Several mild to moderate problems are known to occur with this HPV vaccine. These do not last long and go away on their own.  Reactions in the arm where the shot was given:  Pain (about 8 people in 10)  Redness or swelling (about 1 person in 4)  Fever:  Mild (100 F) (about 1 person in 10)  Moderate (102 F) (about 1 person in 71)  Other problems:  Headache (about 1 person in 3)  Fainting: Brief fainting spells and related symptoms (such as jerking movements) can happen after any medical procedure, including vaccination. Sitting or lying down for about 15 minutes after a vaccination can help prevent fainting and injuries caused by falls. Tell your doctor if the patient feels dizzy or light-headed, or has vision changes or ringing in the ears.  Like all vaccines, HPV vaccines will continue to be monitored for unusual or severe problems. 6. What if there is a serious reaction? What should I look for?  Look for anything that concerns you, such as signs of a severe allergic reaction, very high fever, or behavior changes. Signs of a severe allergic reaction can include hives, swelling of the face and throat, difficulty breathing, a fast heartbeat, dizziness, and weakness. These would start a few minutes to a few hours after the vaccination.  What should I do?  If you think it is a severe allergic reaction or other emergency that can't wait, call 9-1-1 or get the person to the nearest hospital. Otherwise, call your doctor.  Afterward, the reaction should be reported to the Vaccine Adverse Event Reporting System (VAERS). Your doctor might file this report, or you can do it yourself through the VAERS web site at www.vaers.LAgents.no, or by calling 1-825-608-2029. VAERS is only for reporting reactions. They do not give medical advice. 7. The National Vaccine Injury Compensation Program  The [employer] Vaccine Injury  Compensation Program (VICP) is a federal program that was created to compensate people who may have been injured by certain vaccines.  Persons who believe they may have been injured by a vaccine can learn about the program and about filing a claim by calling 1-(386)290-9665 or visiting the VICP website at SpiritualWord.at. 8. How can I learn more?  Ask your doctor.  Call your local or state health department.  Contact the Centers for Disease Control and Prevention (CDC):  Call 336-601-3387 (1-800-CDC-INFO)  or  Visit CDC's website at PicCapture.uy CDC Human Papillomavirus (HPV) Gardasil (Interim) 07/21/11 Document Released: 12/18/2005 Document Revised: 07/07/2013 Document Reviewed: 04/03/2013 [employer] Patient Information 2015 [place], [place]. This information is not intended to replace advice given to you by your health care provider. Make sure you discuss any questions you have with your health care provider.

## 2015-03-21 ENCOUNTER — Encounter (HOSPITAL_COMMUNITY): Payer: Self-pay | Admitting: Emergency Medicine

## 2015-03-21 ENCOUNTER — Emergency Department (HOSPITAL_COMMUNITY): Payer: Self-pay

## 2015-03-21 ENCOUNTER — Emergency Department (HOSPITAL_COMMUNITY)
Admission: EM | Admit: 2015-03-21 | Discharge: 2015-03-21 | Disposition: A | Discharge status: Home / Self Care | Payer: Self-pay | Attending: Emergency Medicine | Admitting: Emergency Medicine

## 2015-03-21 DIAGNOSIS — F17210 Nicotine dependence, cigarettes, uncomplicated: Secondary | ICD-10-CM | POA: Insufficient documentation

## 2015-03-21 DIAGNOSIS — R197 Diarrhea, unspecified: Secondary | ICD-10-CM | POA: Insufficient documentation

## 2015-03-21 DIAGNOSIS — R0789 Other chest pain: Secondary | ICD-10-CM | POA: Insufficient documentation

## 2015-03-21 DIAGNOSIS — R42 Dizziness and giddiness: Secondary | ICD-10-CM | POA: Insufficient documentation

## 2015-03-21 DIAGNOSIS — Z880 Allergy status to penicillin: Secondary | ICD-10-CM | POA: Insufficient documentation

## 2015-03-21 DIAGNOSIS — L299 Pruritus, unspecified: Secondary | ICD-10-CM | POA: Insufficient documentation

## 2015-03-21 DIAGNOSIS — H9209 Otalgia, unspecified ear: Secondary | ICD-10-CM | POA: Insufficient documentation

## 2015-03-21 DIAGNOSIS — R51 Headache: Secondary | ICD-10-CM | POA: Insufficient documentation

## 2015-03-21 DIAGNOSIS — F419 Anxiety disorder, unspecified: Secondary | ICD-10-CM | POA: Insufficient documentation

## 2015-03-21 DIAGNOSIS — F329 Major depressive disorder, single episode, unspecified: Secondary | ICD-10-CM | POA: Insufficient documentation

## 2015-03-21 DIAGNOSIS — H578 Other specified disorders of eye and adnexa: Secondary | ICD-10-CM | POA: Insufficient documentation

## 2015-03-21 DIAGNOSIS — Z3202 Encounter for pregnancy test, result negative: Secondary | ICD-10-CM | POA: Insufficient documentation

## 2015-03-21 DIAGNOSIS — H53149 Visual discomfort, unspecified: Secondary | ICD-10-CM | POA: Insufficient documentation

## 2015-03-21 DIAGNOSIS — Z79899 Other long term (current) drug therapy: Secondary | ICD-10-CM | POA: Insufficient documentation

## 2015-03-21 DIAGNOSIS — J029 Acute pharyngitis, unspecified: Secondary | ICD-10-CM | POA: Insufficient documentation

## 2015-03-21 DIAGNOSIS — R6889 Other general symptoms and signs: Secondary | ICD-10-CM

## 2015-03-21 LAB — CBC
HCT: 41.7 % (ref 36.0–46.0)
Hemoglobin: 14.3 g/dL (ref 12.0–15.0)
MCH: 30.2 pg (ref 26.0–34.0)
MCHC: 34.3 g/dL (ref 30.0–36.0)
MCV: 88 fL (ref 78.0–100.0)
PLATELETS: 240 10*3/uL (ref 150–400)
RBC: 4.74 MIL/uL (ref 3.87–5.11)
RDW: 12.3 % (ref 11.5–15.5)
WBC: 6.7 10*3/uL (ref 4.0–10.5)

## 2015-03-21 LAB — BASIC METABOLIC PANEL
Anion gap: 8 (ref 5–15)
BUN: 10 mg/dL (ref 6–20)
CALCIUM: 9.7 mg/dL (ref 8.9–10.3)
CO2: 24 mmol/L (ref 22–32)
Chloride: 107 mmol/L (ref 101–111)
Creatinine, Ser: 0.69 mg/dL (ref 0.44–1.00)
GFR calc Af Amer: >60 mL/min (ref >60)
Glucose, Bld: 87 mg/dL (ref 65–99)
Potassium: 4.3 mmol/L (ref 3.5–5.1)
SODIUM: 139 mmol/L (ref 135–145)

## 2015-03-21 LAB — I-STAT BETA HCG BLOOD, ED (MC, WL, AP ONLY): I-stat hCG, quantitative: <5 m[IU]/mL (ref <5)

## 2015-03-21 LAB — URINALYSIS, ROUTINE W REFLEX MICROSCOPIC
BILIRUBIN URINE: NEGATIVE
Glucose, UA: NEGATIVE mg/dL
HGB URINE DIPSTICK: NEGATIVE
Ketones, ur: NEGATIVE mg/dL
Leukocytes, UA: NEGATIVE
Nitrite: NEGATIVE
PROTEIN: NEGATIVE mg/dL
SPECIFIC GRAVITY, URINE: 1.011 (ref 1.005–1.030)
pH: 5.5 (ref 5.0–8.0)

## 2015-03-21 LAB — RAPID STREP SCREEN (MED CTR MEBANE ONLY): STREPTOCOCCUS, GROUP A SCREEN (DIRECT): NEGATIVE

## 2015-03-21 LAB — POC URINE PREG, ED: Preg Test, Ur: NEGATIVE

## 2015-03-21 MED ORDER — SODIUM CHLORIDE 0.9 % IV SOLN
INTRAVENOUS | Status: AC
  Administered 2015-03-21: 14:00:00 via INTRAVENOUS

## 2015-03-21 MED ORDER — HYDROCOD POLST-CPM POLST ER 10-8 MG/5ML PO SUER
5.0000 mL | Freq: Once | ORAL | Status: AC
  Administered 2015-03-21: 5 mL via ORAL
  Filled 2015-03-21: qty 5

## 2015-03-21 MED ORDER — PHENYLEPH-PROMETHAZINE-COD 5-6.25-10 MG/5ML PO SYRP: 5.0000 mL | ORAL_SOLUTION | Freq: Three times a day (TID) | ORAL | Status: DC | PRN

## 2015-03-21 NOTE — ED Notes (Signed)
Pt to xray

## 2015-03-21 NOTE — ED Notes (Signed)
Patient given orange juice

## 2015-03-21 NOTE — ED Notes (Signed)
Pt c/o chills, dizziness, fever, cough and body aches ongoing since Monday. Pt sister recently diagnosed with pneumonia and pt has been in close contact with her.

## 2015-03-21 NOTE — ED Provider Notes (Signed)
CSN: [id number]     Arrival date & time 03/21/15  1234 History   First MD Initiated Contact with Patient 03/21/15 1314     Chief Complaint  Patient presents with  . Chills  . Dizziness  . Sore Throat  . Fever  . Generalized Body Aches  . Cough     (Consider location/radiation/quality/duration/timing/severity/associated sxs/prior Treatment) Patient is a 25 y.o. female presenting with URI.  URI Presenting symptoms: congestion, cough, ear pain, fatigue, fever, rhinorrhea and sore throat   Severity:  Moderate Onset quality:  Gradual Duration:  6 days Timing:  Constant Progression:  Worsening Chronicity:  New Relieved by:  None tried Worsened by:  Nothing tried Ineffective treatments:  None tried Associated symptoms: headaches, myalgias, sinus pain and wheezing   Risk factors: sick contacts    Kristina Guerrero is a 25 y.o. female who presents to the ED with cough, congestion, fever, chills, body aches and feeling dizzy x 6 days. She has been with her sister who has pneumonia.   Past Medical History  Diagnosis Date  . Depression   . Anxiety    History reviewed. No pertinent past surgical history. Family History  Problem Relation Age of Onset  . Mental illness Mother    Social History  Substance Use Topics  . Smoking status: Current Every Day Smoker -- 1.00 packs/day    Types: Cigarettes  . Smokeless tobacco: None  . Alcohol Use: 0.6 - 1.2 oz/week    1-2 Standard drinks or equivalent per week     Comment: social   OB History    No data available     Review of Systems  Constitutional: Positive for fever and fatigue.  HENT: Positive for congestion, ear pain, rhinorrhea, sinus pressure and sore throat.   Eyes: Positive for photophobia, redness and itching. Negative for pain and visual disturbance.  Respiratory: Positive for cough, chest tightness and wheezing.   Cardiovascular: Negative for chest pain, palpitations and leg swelling.  Gastrointestinal: Positive for  diarrhea. Negative for nausea and vomiting.  Genitourinary: Negative for dysuria, urgency, frequency, decreased urine volume, vaginal bleeding and vaginal discharge.  Musculoskeletal: Positive for myalgias. Negative for neck stiffness.  Skin: Negative for rash and wound.  Neurological: Positive for dizziness and headaches. Negative for syncope.  Psychiatric/Behavioral: Negative for confusion and sleep disturbance. Nervous/anxious: hx of.       Allergies  Penicillins  Home Medications   Prior to Admission medications   Medication Sig Start Date End Date Taking? Authorizing Provider  busPIRone (BUSPAR) 10 MG tablet Take 1 tablet (10 mg total) by mouth 2 (two) times daily. For anxiety 06/17/14   Shuvon B Rankin, NP  citalopram (CELEXA) 20 MG tablet Take 1 tablet (20 mg total) by mouth daily. For depression 06/17/14   Shuvon B Rankin, NP  hydrOXYzine (ATARAX/VISTARIL) 25 MG tablet Take 1 tablet (25 mg total) by mouth 3 (three) times daily as needed for anxiety (sleep). 06/17/14   Shuvon B Rankin, NP  Phenyleph-Promethazine-Cod 5-6.25-10 MG/5ML SYRP Take 5 mLs by mouth 3 (three) times daily as needed. 03/21/15   Hope Orlene Och, NP  traZODone (DESYREL) 50 MG tablet Take 1 tablet (50 mg total) by mouth at bedtime as needed for sleep. 06/17/14   Shuvon B Rankin, NP   BP 120/80 mmHg  Pulse 90  Temp(Src) 98.8 F (37.1 C) (Oral)  Resp 16  Ht 5\' 4"  (1.626 m)  SpO2 98%  LMP 02/28/2015 Physical Exam  Constitutional: She is oriented to person,  place, and time. She appears well-developed and well-nourished. No distress.  HENT:  Head: Normocephalic and atraumatic.  Right Ear: Tympanic membrane normal.  Left Ear: Tympanic membrane normal.  Nose: Mucosal edema and rhinorrhea present. Right sinus exhibits maxillary sinus tenderness. Left sinus exhibits maxillary sinus tenderness.  Mouth/Throat: Uvula is midline and mucous membranes are normal. Posterior oropharyngeal erythema present.  Eyes: EOM are  normal.  Neck: Normal range of motion. Neck supple.  Cardiovascular: Normal rate and regular rhythm.   Pulmonary/Chest: Effort normal and breath sounds normal. She has no wheezes. She has no rales.  Abdominal: Soft. Bowel sounds are normal. There is no tenderness.  Musculoskeletal: Normal range of motion.  Lymphadenopathy:    She has no cervical adenopathy.  Neurological: She is alert and oriented to person, place, and time. No cranial nerve deficit.  Skin: Skin is warm and dry.  Psychiatric: She has a normal mood and affect. Her behavior is normal.  Nursing note and vitals reviewed.   ED Course Labs, IV hydration, tussionex   Procedures  Normal chest x-ray and labs  Patient feeling better after IV hydration and cough medication. MDM  SUBJECTIVE:  Kristina Guerrero is a 25 y.o. female who present complaining of flu-like symptoms: fevers, chills, myalgias, congestion, sore throat and cough for 6 days. Denies dyspnea or wheezing.  OBJECTIVE: Appears moderately ill but not toxic; temperature as noted in vitals. Ears normal. Throat and pharynx normal.  Neck supple. No adenopathy in the neck. Sinuses non tender. The chest is clear.  ASSESSMENT: Influenza  PLAN: Symptomatic therapy suggested: rest, increase fluids, gargle prn for sore throat, use mist of vaporizer prn and return if symptoms persist or worsen or do not improve as anticipated. Since it has been 6  Days Tami Flu no indicated. Will treat for cough with Tussionex.   Final diagnoses:  Flu-like symptoms       Janne NapoleonHope M Neese, NP 03/23/15 2136  Pricilla LovelessScott Goldston, MD 03/28/15 [phone]

## 2015-03-21 NOTE — Discharge Instructions (Signed)
Do not drive while taking the cough medication as it will make you sleepy. Return as needed for worsening symptoms.

## 2015-03-23 LAB — CULTURE, GROUP A STREP (THRC)

## 2016-06-24 ENCOUNTER — Encounter (HOSPITAL_COMMUNITY): Payer: Self-pay | Admitting: Emergency Medicine

## 2016-06-24 DIAGNOSIS — F17210 Nicotine dependence, cigarettes, uncomplicated: Secondary | ICD-10-CM | POA: Insufficient documentation

## 2016-06-24 DIAGNOSIS — R1033 Periumbilical pain: Secondary | ICD-10-CM | POA: Insufficient documentation

## 2016-06-24 LAB — POC URINE PREG, ED: PREG TEST UR: NEGATIVE

## 2016-06-24 NOTE — ED Triage Notes (Signed)
Pt reports having lower abd pain and back pain that stated at  2100 and pain with urination.

## 2016-06-24 NOTE — ED Notes (Signed)
Pt c/o lower right abdominal pain, back pain, and dysuria. Hx of ovarian cysts. Pt stated that she, "felt a pop which was the height of the pain."

## 2016-06-25 ENCOUNTER — Emergency Department (HOSPITAL_COMMUNITY): Payer: Self-pay

## 2016-06-25 ENCOUNTER — Emergency Department (HOSPITAL_COMMUNITY)
Admission: EM | Admit: 2016-06-25 | Discharge: 2016-06-25 | Disposition: A | Discharge status: Home / Self Care | Payer: Self-pay | Attending: Emergency Medicine | Admitting: Emergency Medicine

## 2016-06-25 DIAGNOSIS — R1030 Lower abdominal pain, unspecified: Secondary | ICD-10-CM

## 2016-06-25 DIAGNOSIS — R1033 Periumbilical pain: Secondary | ICD-10-CM

## 2016-06-25 LAB — COMPREHENSIVE METABOLIC PANEL
ALK PHOS: 74 U/L (ref 38–126)
ALT: 18 U/L (ref 14–54)
ANION GAP: 8 (ref 5–15)
AST: 18 U/L (ref 15–41)
Albumin: 4.2 g/dL (ref 3.5–5.0)
BUN: 15 mg/dL (ref 6–20)
CALCIUM: 9.3 mg/dL (ref 8.9–10.3)
CHLORIDE: 104 mmol/L (ref 101–111)
CO2: 26 mmol/L (ref 22–32)
Creatinine, Ser: 0.55 mg/dL (ref 0.44–1.00)
GFR calc Af Amer: >60 mL/min (ref >60)
GFR calc non Af Amer: >60 mL/min (ref >60)
Glucose, Bld: 102 mg/dL — ABNORMAL HIGH (ref 65–99)
POTASSIUM: 3.6 mmol/L (ref 3.5–5.1)
SODIUM: 138 mmol/L (ref 135–145)
Total Bilirubin: 0.5 mg/dL (ref 0.3–1.2)
Total Protein: 7.2 g/dL (ref 6.5–8.1)

## 2016-06-25 LAB — WET PREP, GENITAL
SPERM: NONE SEEN
Trich, Wet Prep: NONE SEEN
YEAST WET PREP: NONE SEEN

## 2016-06-25 LAB — CBC
HEMATOCRIT: 40.9 % (ref 36.0–46.0)
HEMOGLOBIN: 13.6 g/dL (ref 12.0–15.0)
MCH: 30.2 pg (ref 26.0–34.0)
MCHC: 33.3 g/dL (ref 30.0–36.0)
MCV: 90.7 fL (ref 78.0–100.0)
Platelets: 282 10*3/uL (ref 150–400)
RBC: 4.51 MIL/uL (ref 3.87–5.11)
RDW: 12.9 % (ref 11.5–15.5)
WBC: 9.1 10*3/uL (ref 4.0–10.5)

## 2016-06-25 LAB — LIPASE, BLOOD: Lipase: 21 U/L (ref 11–51)

## 2016-06-25 LAB — URINALYSIS, ROUTINE W REFLEX MICROSCOPIC
Bilirubin Urine: NEGATIVE
Glucose, UA: NEGATIVE mg/dL
Hgb urine dipstick: NEGATIVE
KETONES UR: NEGATIVE mg/dL
Leukocytes, UA: NEGATIVE
NITRITE: NEGATIVE
PH: 7 (ref 5.0–8.0)
Protein, ur: NEGATIVE mg/dL
SPECIFIC GRAVITY, URINE: 1.01 (ref 1.005–1.030)

## 2016-06-25 MED ORDER — DICYCLOMINE HCL 20 MG PO TABS: 20.0000 mg | ORAL_TABLET | Freq: Two times a day (BID) | ORAL | 0 refills | Status: AC

## 2016-06-25 MED ORDER — MORPHINE SULFATE (PF) 4 MG/ML IV SOLN
4.0000 mg | Freq: Once | INTRAVENOUS | Status: AC
  Administered 2016-06-25: 4 mg via INTRAMUSCULAR
  Filled 2016-06-25: qty 1

## 2016-06-25 MED ORDER — MORPHINE SULFATE (PF) 4 MG/ML IV SOLN: 4.0000 mg | Freq: Once | INTRAVENOUS | Status: DC

## 2016-06-25 MED ORDER — IOPAMIDOL (ISOVUE-300) INJECTION 61%
INTRAVENOUS | Status: AC
  Administered 2016-06-25: 100 mL via INTRAVENOUS
  Filled 2016-06-25: qty 100

## 2016-06-25 MED ORDER — IBUPROFEN 600 MG PO TABS: 600.0000 mg | ORAL_TABLET | Freq: Four times a day (QID) | ORAL | 0 refills | Status: AC | PRN

## 2016-06-25 MED ORDER — METRONIDAZOLE 500 MG PO TABS: 500.0000 mg | ORAL_TABLET | Freq: Two times a day (BID) | ORAL | 0 refills | Status: AC

## 2016-06-25 NOTE — ED Provider Notes (Signed)
Received sign-out of patient from [facility], PA-C at shift change with CT abdomen and pelvis pending to evaluate for appendicitis. Plan to discharge home with NSAIDs for symptomatically a CT abdomen and pelvis was negative for any acute process.  CT abdomen and pelvis shows no discrete visualization of the appendix but no other significant signs of appendicitis. Negative for any other acute abnormality. Re-evaluation of patient, still with some tenderness diffusely throughout lower abdomen. She states that she does feel like the symptoms have improved since onset. Labs reviewed with patient. Discussed results with patient. Explained her that while there are no signs of appendicitis on the CT scan, she could still have a fairly early case of appendicitis. Patient is comfortable with being discharged home. Strict return precautions discussed with patient. Provided patient with a list of clinic resources to use if he does not have a PCP. Instructed to call them today to arrange follow-up in the next 24-48 hours. Patient expresses understanding and agreement plan.    Maxwell Caul, PA-C 06/25/16 1621    Tomasita Crumble, MD 06/26/16 [phone]

## 2016-06-25 NOTE — ED Provider Notes (Signed)
[facility]-EMERGENCY DEPT Provider Note   CSN: [id number] Arrival date & time: 06/24/16  2217     History   Chief Complaint Chief Complaint  Patient presents with  . Abdominal Pain    HPI Kristina Guerrero is a 26 y.o. female.  The history is provided by the patient and medical records. No language interpreter was used.  Abdominal Pain     Kristina Guerrero is a 26 y.o. female  with a PMH of anxiety, depression who presents to the Emergency Department complaining of sharp right lower quadrant pain which feels similar to ruptured ovarian cysts which she has experienced in the past. She states she was having low back pain all day and intermittent dysuria. Today at 10pm, she felt the acute onset of RLQ pain similar to prior cysts. No medications taken prior to arrival for symptoms. No alleviating or aggravating factors noted. No fevers, chills, chest pain, shortness of breath, vaginal discharge, urinary frequency/urgency.    Past Medical History:  Diagnosis Date  . Anxiety   . Depression     Patient Active Problem List   Diagnosis Date Noted  . Severe recurrent major depressive disorder with psychotic features (HCC)   . Major depressive disorder with psychotic features (HCC) 06/11/2014  . Suicidal ideations     History reviewed. No pertinent surgical history.  OB History    No data available       Home Medications    Prior to Admission medications   Medication Sig Start Date End Date Taking? Authorizing Provider  citalopram (CELEXA) 20 MG tablet Take 1 tablet (20 mg total) by mouth daily. For depression 06/17/14  Yes Shuvon B Rankin, NP  ibuprofen (ADVIL,MOTRIN) 600 MG tablet Take 600 mg by mouth every 6 (six) hours as needed.   Yes Historical Provider, MD  loratadine (CLARITIN) 10 MG tablet Take 10 mg by mouth daily.   Yes Historical Provider, MD    Family History Family History  Problem Relation Age of Onset  . Mental illness Mother     Social History Social  History  Substance Use Topics  . Smoking status: Current Every Day Smoker    Packs/day: 1.00    Types: Cigarettes  . Smokeless tobacco: Never Used  . Alcohol use 0.6 - 1.2 oz/week    1 - 2 Standard drinks or equivalent per week     Comment: social     Allergies   Penicillins   Review of Systems Review of Systems  Gastrointestinal: Positive for abdominal pain.  Musculoskeletal: Positive for back pain.  All other systems reviewed and are negative.    Physical Exam Updated Vital Signs BP 113/63 (BP Location: Left Arm)   Pulse (!) 59   Temp 98 F (36.7 C) (Oral)   Resp 18   Ht  (1.6 m)   Wt 65.8 kg   LMP 06/08/2016   SpO2 99%   BMI 25.69 kg/m   Physical Exam  Constitutional: She is oriented to person, place, and time. She appears well-developed and well-nourished. No distress.  HENT:  Head: Normocephalic and atraumatic.  Cardiovascular: Normal rate, regular rhythm and normal heart sounds.   No murmur heard. Pulmonary/Chest: Effort normal and breath sounds normal. No respiratory distress.  Abdominal: Soft. She exhibits no distension. There is tenderness. There is no CVA tenderness.    Genitourinary:  Genitourinary Comments: Chaperone present for exam. + discharge. + right adnexal tenderness. No CMT.   Neurological: She is alert and oriented to person, place, and  time.  Skin: Skin is warm and dry.  Nursing note and vitals reviewed.    ED Treatments / Results  Labs (all labs ordered are listed, but only abnormal results are displayed) Labs Reviewed  WET PREP, GENITAL - Abnormal; Notable for the following:       Result Value   Clue Cells Wet Prep HPF POC PRESENT (*)    WBC, Wet Prep HPF POC MANY (*)    All other components within normal limits  COMPREHENSIVE METABOLIC PANEL - Abnormal; Notable for the following:    Glucose, Bld 102 (*)    All other components within normal limits  URINALYSIS, ROUTINE W REFLEX MICROSCOPIC  LIPASE, BLOOD  CBC  POC  URINE PREG, ED  GC/CHLAMYDIA PROBE AMP ([place]) NOT AT [facility]  GC/CHLAMYDIA PROBE AMP ([place]) NOT AT [facility]    EKG  EKG Interpretation None       Radiology US Transvaginal Non-ob  Result Date: 06/25/2016 CLINICAL DATA:  Patient with right-sided pelvic pain for 8 hours. EXAM: TRANSABDOMINAL AND TRANSVAGINAL ULTRASOUND OF PELVIS DOPPLER ULTRASOUND OF OVARIES TECHNIQUE: Both transabdominal and transvaginal ultrasound examinations of the pelvis were performed. Transabdominal technique was performed for global imaging of the pelvis including uterus, ovaries, adnexal regions, and pelvic cul-de-sac. It was necessary to proceed with endovaginal exam following the transabdominal exam to visualize the adnexal structures. Color and duplex Doppler ultrasound was utilized to evaluate blood flow to the ovaries. COMPARISON:  None. FINDINGS: Uterus Measurements: 7.6 x 3.9 x 5.1 cm. No fibroids or other mass visualized. Endometrium Thickness: 9.4 mm.  No focal abnormality visualized. Right ovary Measurements: 3.6 x 1.2 x 1.3 cm. Normal appearance/no adnexal mass. Left ovary Measurements: 4.1 x 1.9 x 2.2 cm. Normal appearance/no adnexal mass. Pulsed Doppler evaluation of both ovaries demonstrates normal low-resistance arterial and venous waveforms. Other findings Small amount of free fluid in the pelvis. IMPRESSION: Normal sonographic appearance of the ovaries. No sonographic evidence to suggest torsion. No acute process identified. Electronically Signed   By: Annia Belt M.D.   On: 06/25/2016 07:41   US Pelvis Complete  Result Date: 06/25/2016 CLINICAL DATA:  Patient with right-sided pelvic pain for 8 hours. EXAM: TRANSABDOMINAL AND TRANSVAGINAL ULTRASOUND OF PELVIS DOPPLER ULTRASOUND OF OVARIES TECHNIQUE: Both transabdominal and transvaginal ultrasound examinations of the pelvis were performed. Transabdominal technique was performed for global imaging of the pelvis including uterus, ovaries, adnexal  regions, and pelvic cul-de-sac. It was necessary to proceed with endovaginal exam following the transabdominal exam to visualize the adnexal structures. Color and duplex Doppler ultrasound was utilized to evaluate blood flow to the ovaries. COMPARISON:  None. FINDINGS: Uterus Measurements: 7.6 x 3.9 x 5.1 cm. No fibroids or other mass visualized. Endometrium Thickness: 9.4 mm.  No focal abnormality visualized. Right ovary Measurements: 3.6 x 1.2 x 1.3 cm. Normal appearance/no adnexal mass. Left ovary Measurements: 4.1 x 1.9 x 2.2 cm. Normal appearance/no adnexal mass. Pulsed Doppler evaluation of both ovaries demonstrates normal low-resistance arterial and venous waveforms. Other findings Small amount of free fluid in the pelvis. IMPRESSION: Normal sonographic appearance of the ovaries. No sonographic evidence to suggest torsion. No acute process identified. Electronically Signed   By: Annia Belt M.D.   On: 06/25/2016 07:41   [place] Art/ven Flow Abd Pelv Doppler  Result Date: 06/25/2016 CLINICAL DATA:  Patient with right-sided pelvic pain for 8 hours. EXAM: TRANSABDOMINAL AND TRANSVAGINAL ULTRASOUND OF PELVIS DOPPLER ULTRASOUND OF OVARIES TECHNIQUE: Both transabdominal and transvaginal ultrasound examinations of the pelvis  were performed. Transabdominal technique was performed for global imaging of the pelvis including uterus, ovaries, adnexal regions, and pelvic cul-de-sac. It was necessary to proceed with endovaginal exam following the transabdominal exam to visualize the adnexal structures. Color and duplex Doppler ultrasound was utilized to evaluate blood flow to the ovaries. COMPARISON:  None. FINDINGS: Uterus Measurements: 7.6 x 3.9 x 5.1 cm. No fibroids or other mass visualized. Endometrium Thickness: 9.4 mm.  No focal abnormality visualized. Right ovary Measurements: 3.6 x 1.2 x 1.3 cm. Normal appearance/no adnexal mass. Left ovary Measurements: 4.1 x 1.9 x 2.2 cm. Normal appearance/no adnexal mass. Pulsed  Doppler evaluation of both ovaries demonstrates normal low-resistance arterial and venous waveforms. Other findings Small amount of free fluid in the pelvis. IMPRESSION: Normal sonographic appearance of the ovaries. No sonographic evidence to suggest torsion. No acute process identified. Electronically Signed   By: Annia Belt M.D.   On: 06/25/2016 07:41    Procedures Procedures (including critical care time)  Medications Ordered in ED Medications  morphine 4 MG/ML injection 4 mg (4 mg Intramuscular Given 06/25/16 0602)     Initial Impression / Assessment and Plan / ED Course  I have reviewed the triage vital signs and the nursing notes.  Pertinent labs & imaging results that were available during my care of the patient were reviewed by me and considered in my medical decision making (see chart for details).    Kristina Guerrero is a 27 y.o. female who presents to ED for RLQ which she states feels similar to when she had an ovarian cyst rupture in the past. On exam, she is afebrile, hemodynamically stable with tenderness across the lower abdomen but much more significantly on the RLQ. Pelvic with clue cells and WBC's - will treat with flagyl. Labs unremarkable. UA wdl. Ultrasound obtained and negative for acute process. Patient re-evaluated. Abdominal exam improved but still concerning tenderness to RLQ. CT abd/pelvis ordered and pending at shift change. Care assumed by oncoming provider PA [place]. Case discussed, plan agreed upon. Will follow up on CT imaging and dispo appropriately. If negative likely discharge home with OBGYN follow up and NSAID's.   Final Clinical Impressions(s) / ED Diagnoses   Final diagnoses:  Ovarian torsion  Ovarian torsion  Ovarian torsion    New Prescriptions New Prescriptions   No medications on file     [facility], PA-C 06/25/16 1610    Tomasita Crumble, MD 06/25/16 1423

## 2016-06-25 NOTE — Discharge Instructions (Addendum)
As we discussed, the CT showed no signs of appendicitis but this could still be an early appendicitis. Monitor symptoms closely. Return to emergency department for any worsening pain, high fever, persistent vomiting, inability to drink or eat, or any other worsening or concerning symptoms.  He may take the Bentyl as needed for abdominal cramping.  Take the antibiotics as prescribed for treatment of bacterial vaginosis.  Follow-up with one of the referred clinics below.   If you do not have a primary care doctor you see regularly, please you the list below. Please call them to arrange for follow-up.    No Primary Care Doctor [facility]  [phone] Other agencies that provide inexpensive medical care    [facility]  [phone]    [facility]  [phone]    [facility]  [phone]    [facility]  [phone]    [facility]  [phone]    [facility]  [phone]

## 2016-06-26 LAB — GC/CHLAMYDIA PROBE AMP (~~LOC~~) NOT AT ARMC
Chlamydia: NEGATIVE
Neisseria Gonorrhea: NEGATIVE

## 2017-01-09 IMAGING — DX DG CHEST 2V
3 series · 3 of 3 positions shown · non-contrast
Comparison: None.

CLINICAL DATA: Productive cough. Chest pain. Shortness of breath.
Body aches for 6 days.

EXAM:
CHEST  2 VIEW

[chest lat]
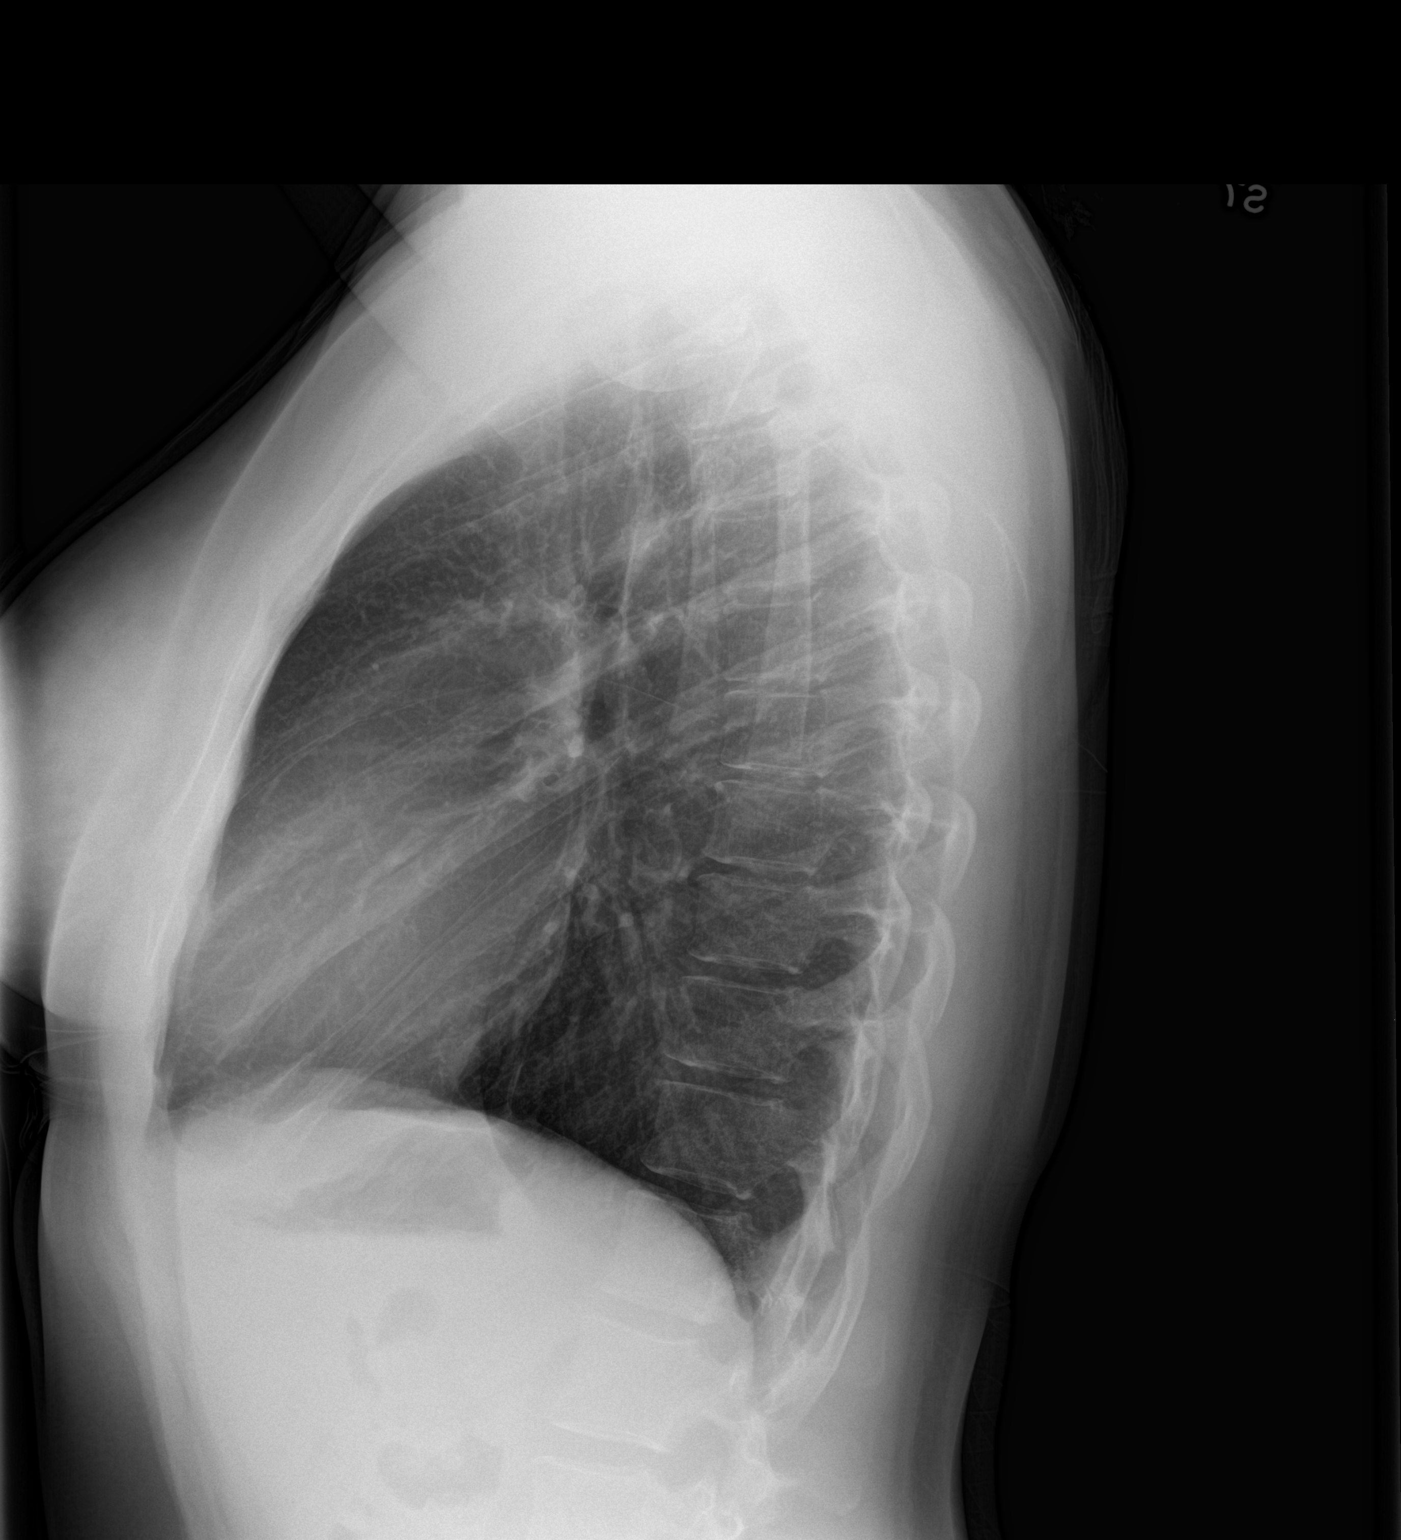

[chest pa (1 of 2)]
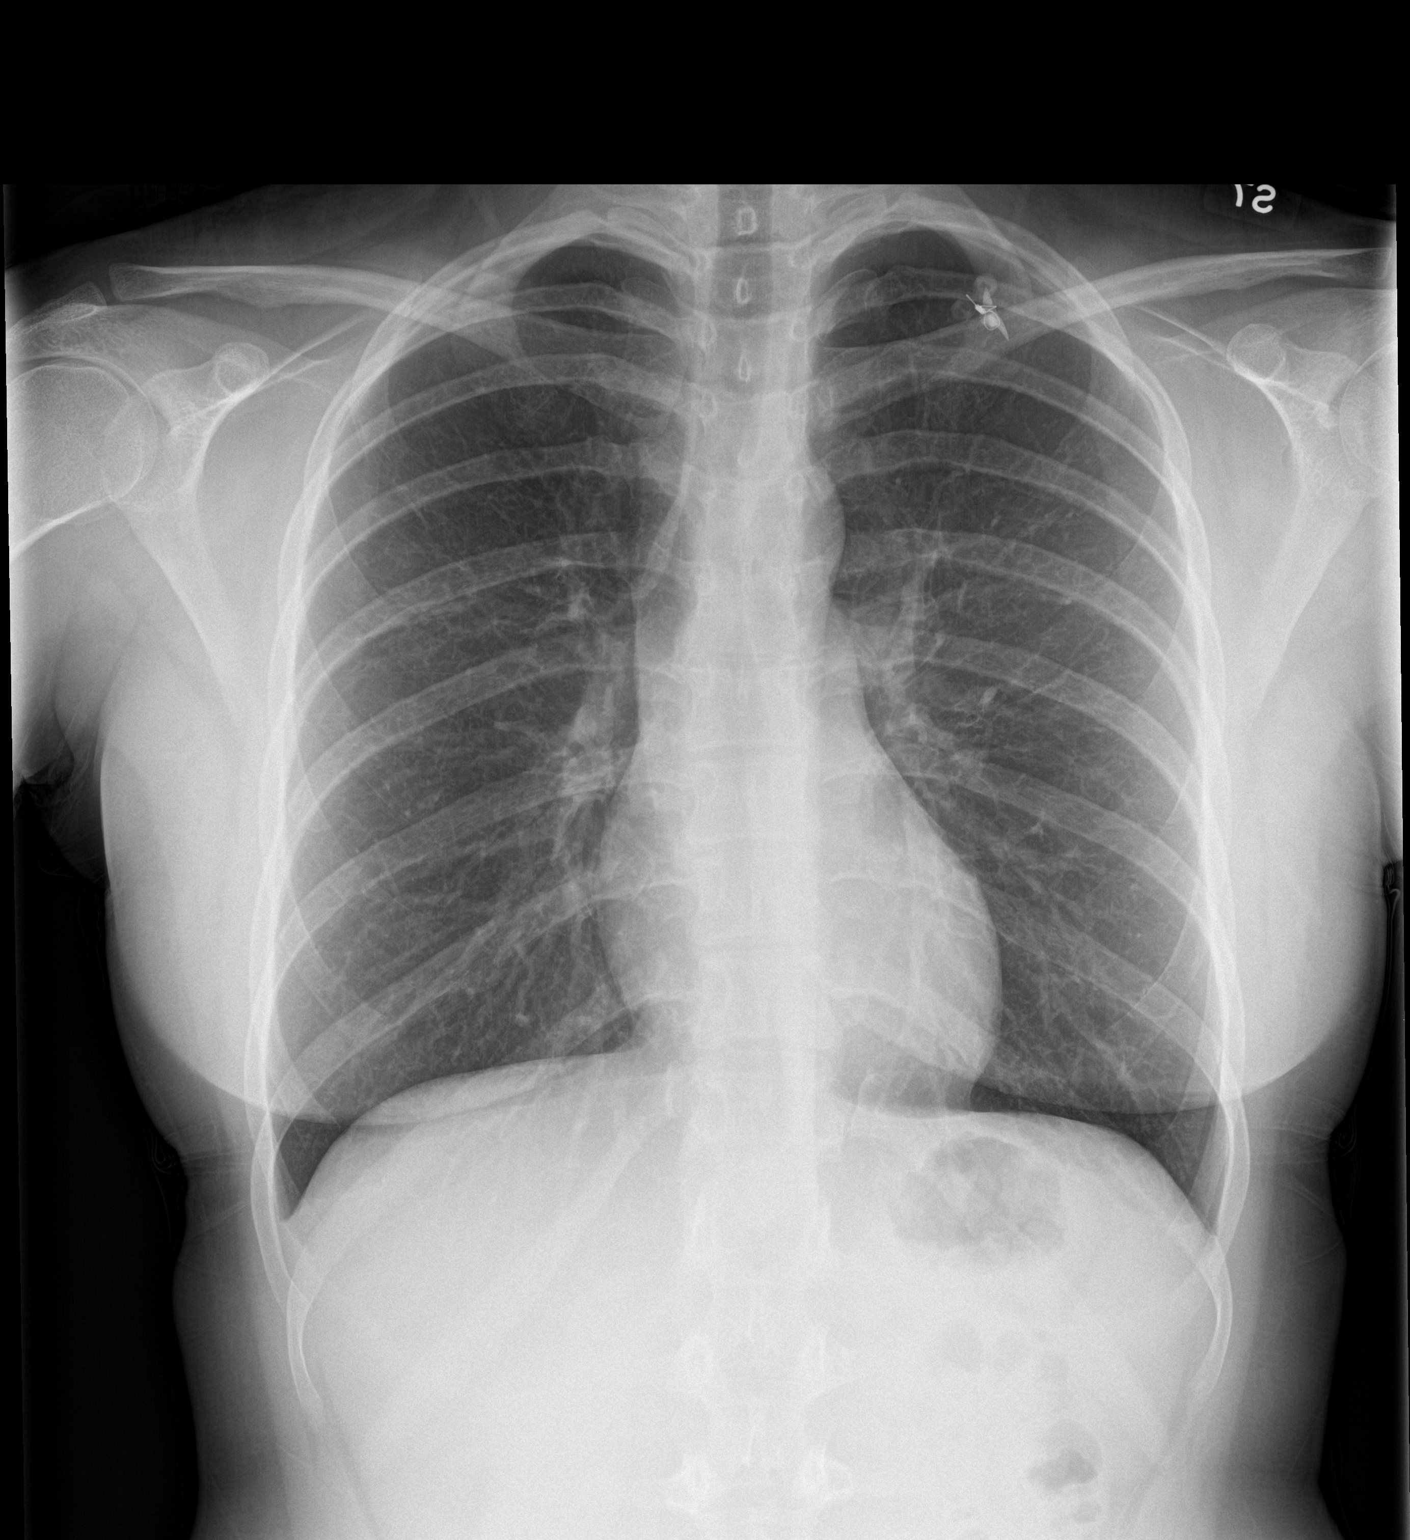

[chest pa (2 of 2)]
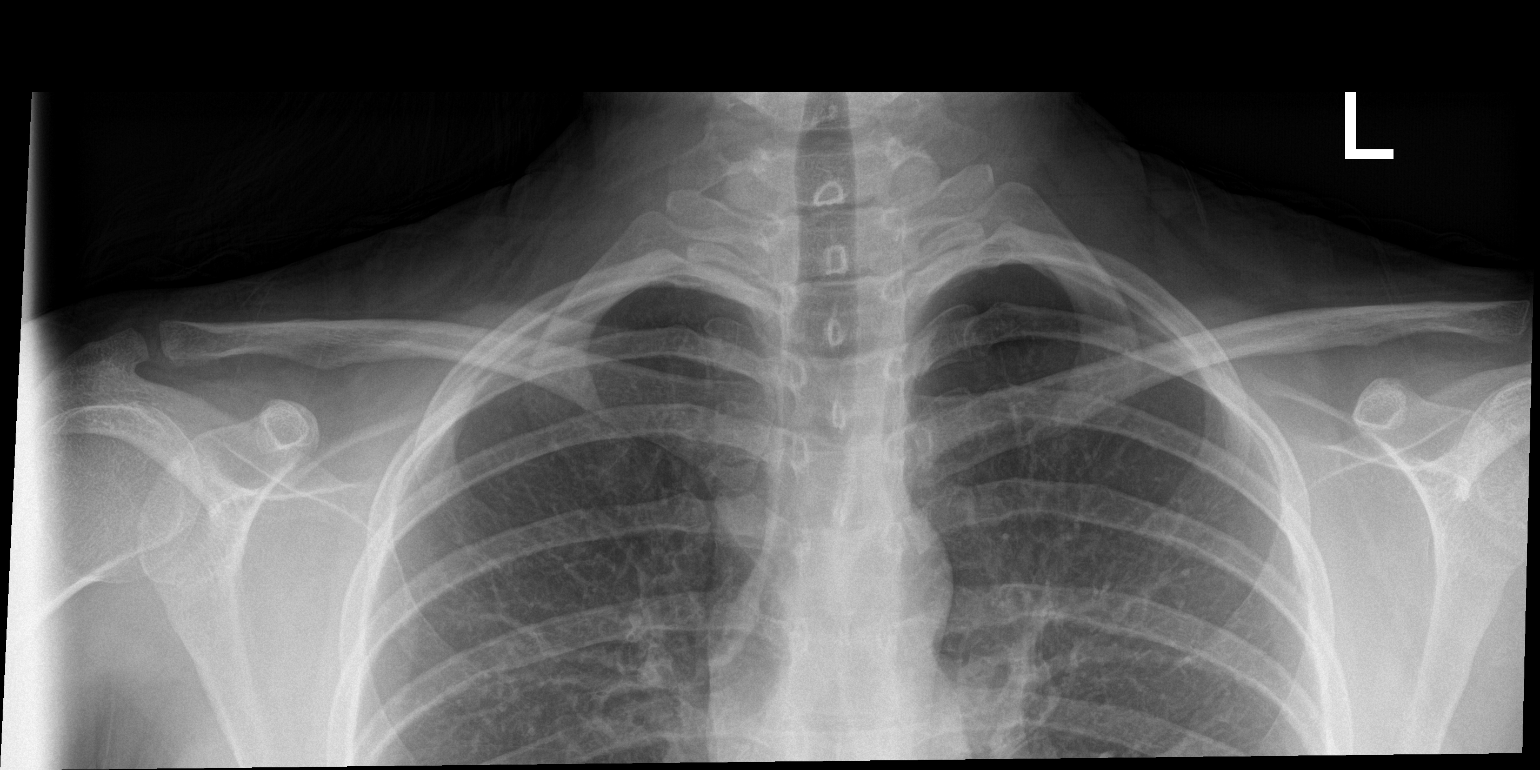

[3 of 3 positions shown; findings below may reference images not displayed]

FINDINGS: The heart size and mediastinal contours are within normal limits.
Both lungs are clear. The visualized skeletal structures are
unremarkable.
IMPRESSION: Negative.  No active cardiopulmonary disease.

## 2018-04-16 IMAGING — CT CT ABD-PELV W/ CM
2 of 4 series · 16 of 46 positions shown, 18 images · IV contrast (ISOVUE)
Comparison: Current pelvic ultrasound.

CLINICAL DATA: Right lower quadrant pain.

EXAM:
CT ABDOMEN AND PELVIS WITH CONTRAST
TECHNIQUE: Multidetector CT imaging of the abdomen and pelvis was performed
using the standard protocol following bolus administration of
intravenous contrast.
CONTRAST:  100mL D4LGQK-VZZ IOPAMIDOL (D4LGQK-VZZ) INJECTION 61%

[Series 2: abd/pel with · axial · 0.69mm/px · z∈[-404,-19]mm · 13 of 87 slices shown, 15 images]
[im 5/87  soft-tissue]
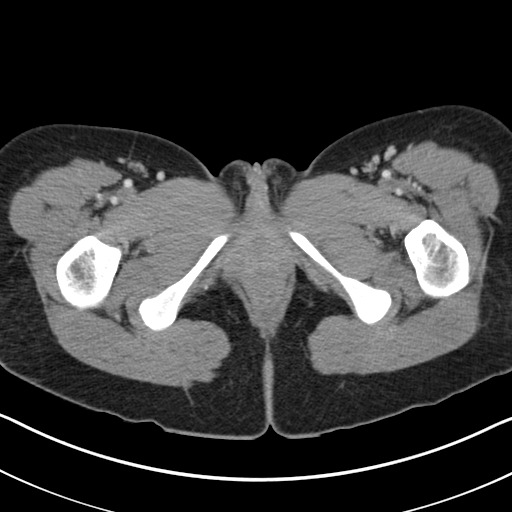
[im 5/87  bone]
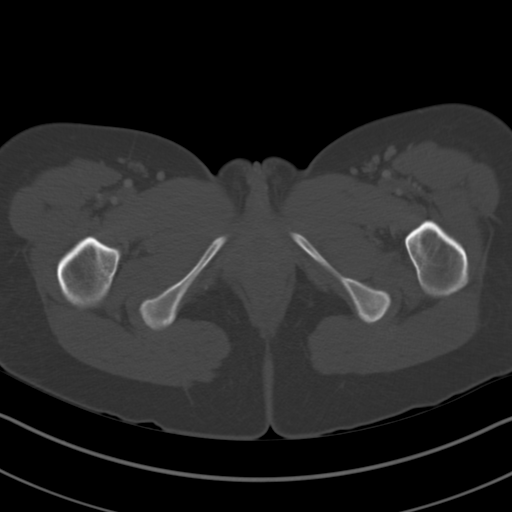
[im 10/87  soft-tissue]
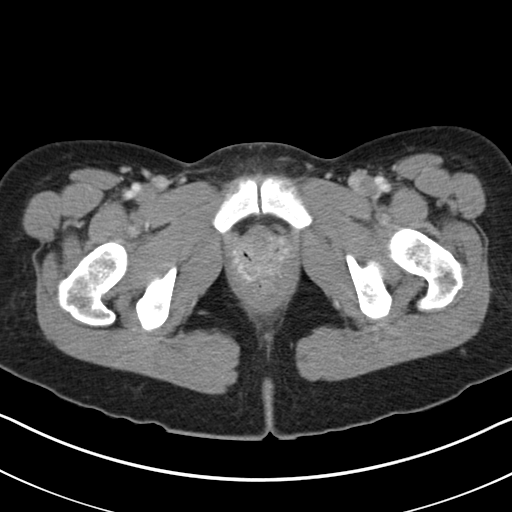
[im 20/87  soft-tissue]
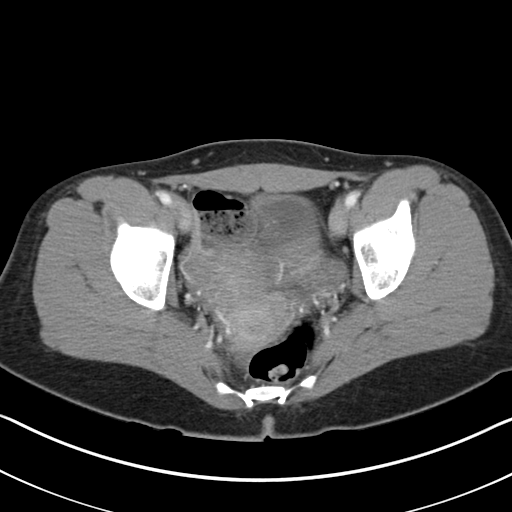
[im 24/87  soft-tissue]
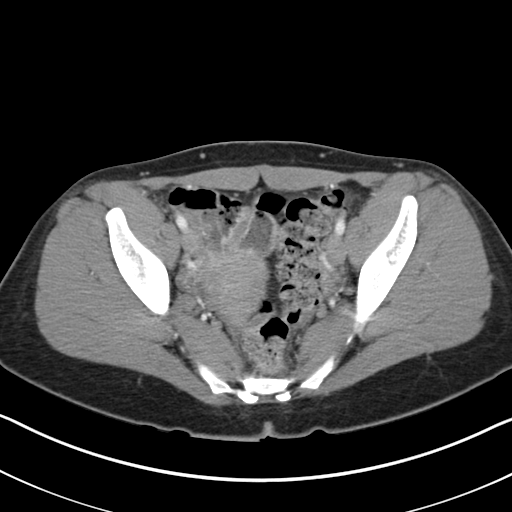
[im 29/87  soft-tissue]
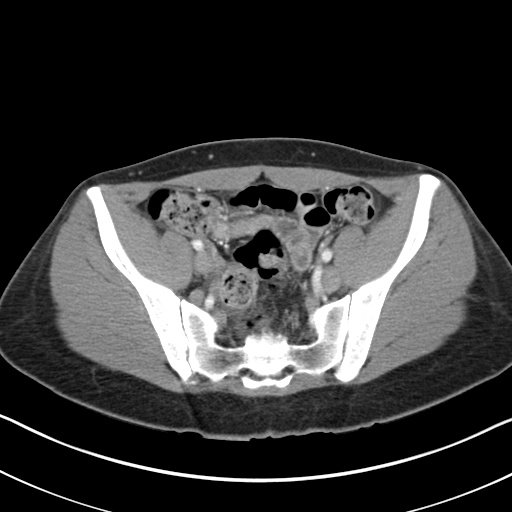
[im 39/87  soft-tissue]
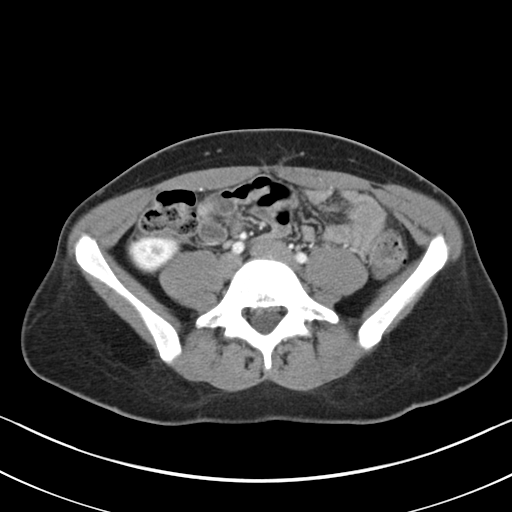
[im 44/87  soft-tissue]
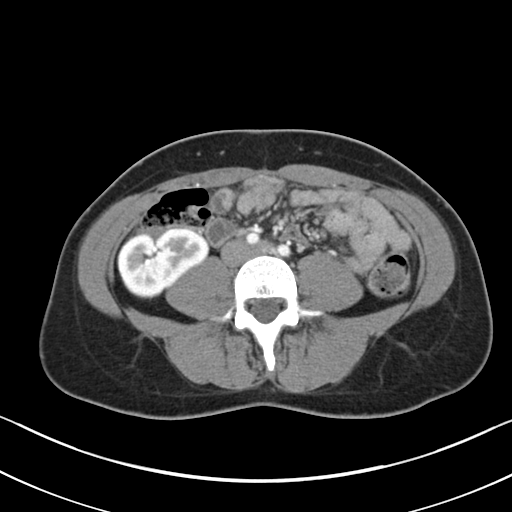
[im 48/87  soft-tissue]
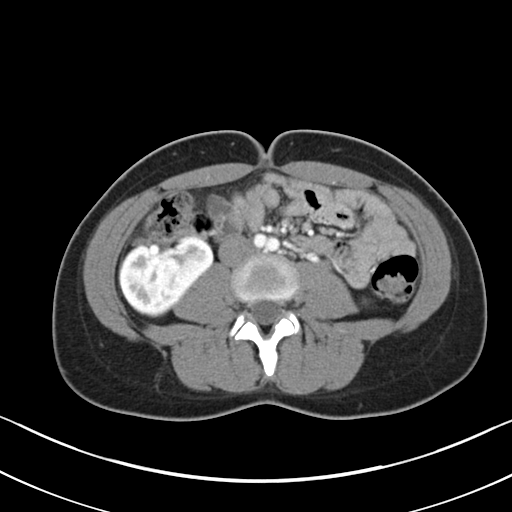
[im 58/87  soft-tissue]
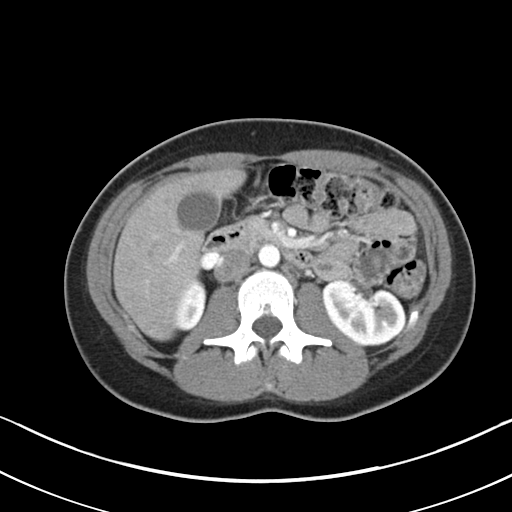
[im 58/87  bone]
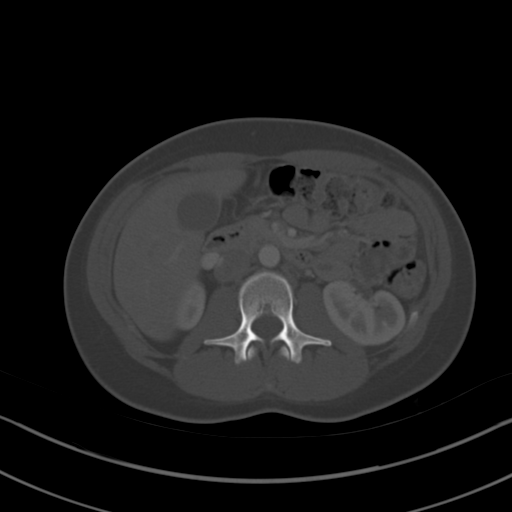
[im 63/87  soft-tissue]
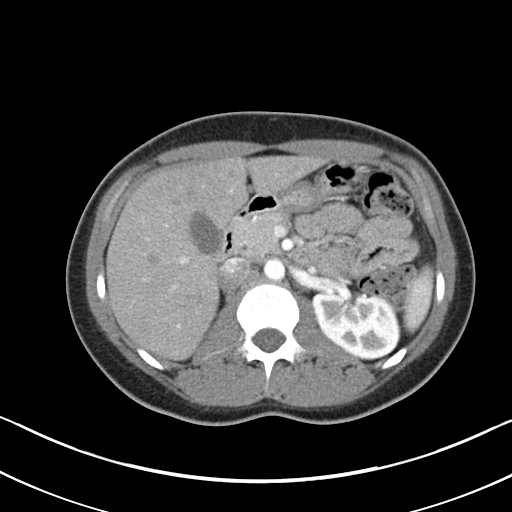
[im 67/87  soft-tissue]
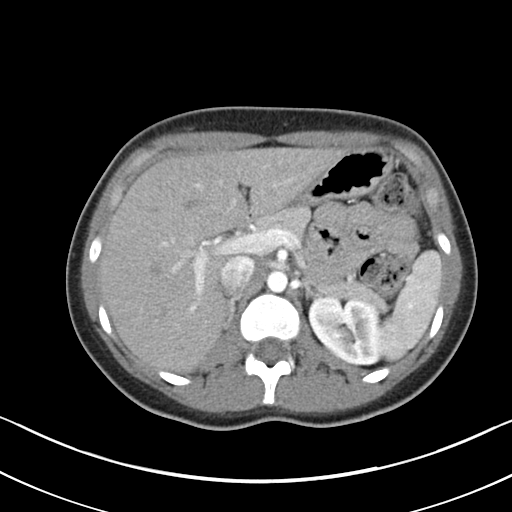
[im 77/87  soft-tissue]
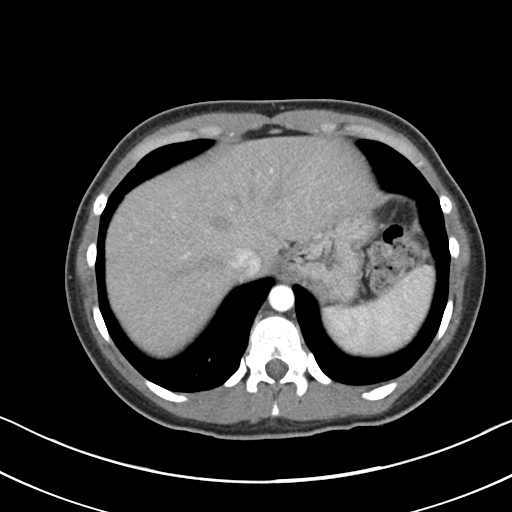
[im 82/87  soft-tissue]
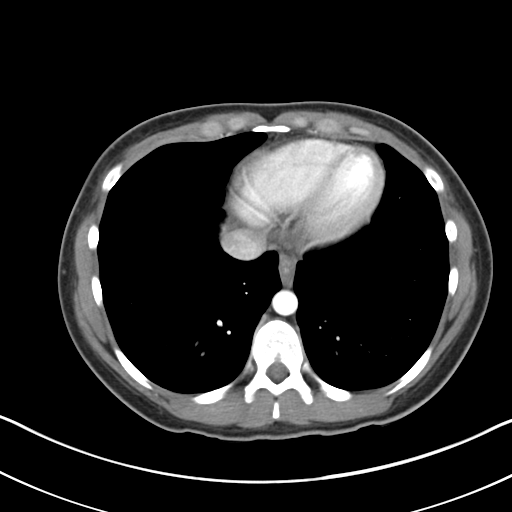

[Series 5: coronal a/|p · coronal · 0.72mm/px · 3 of 114 slices shown]
[im 38/114  soft-tissue]
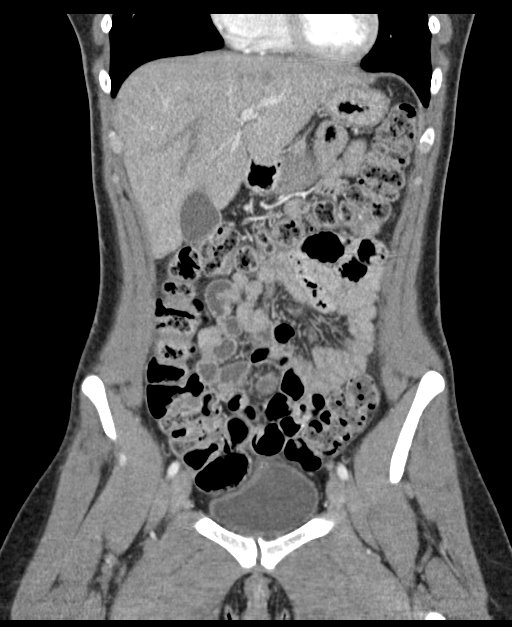
[im 51/114  soft-tissue]
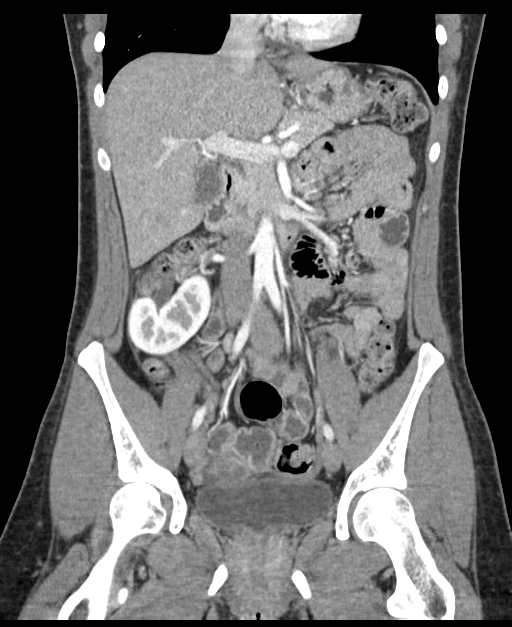
[im 63/114  soft-tissue]
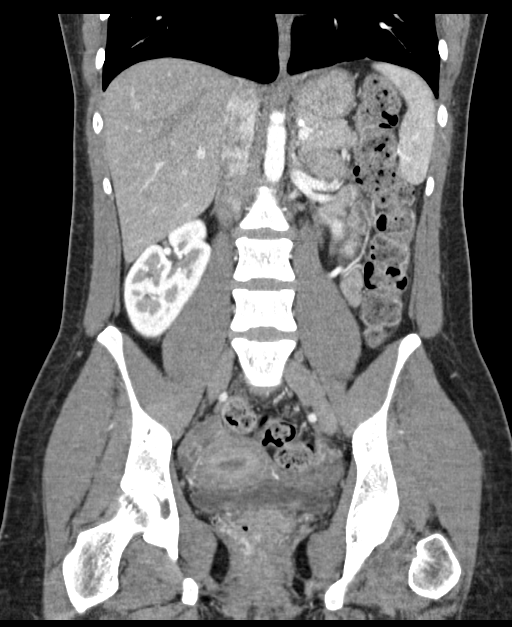

[16 of 46 positions shown; findings below may reference images not displayed]

FINDINGS: Lower chest: Clear lung bases.  Heart normal size.

Hepatobiliary: No focal liver abnormality is seen. No gallstones,
gallbladder wall thickening, or biliary dilatation.

Pancreas: Unremarkable. No pancreatic ductal dilatation or
surrounding inflammatory changes.

Spleen: Normal in size without focal abnormality.

Adrenals/Urinary Tract: No adrenal masses. Both kidneys are under
rotated. 1 cm homogeneous oval circumscribed mass arises from the
posterior midpole the right kidney consistent with a cyst. No other
renal masses, no stones and no hydronephrosis. Ureters are normal
course and in caliber. Bladder is unremarkable.

Stomach/Bowel: Stomach, small bowel and colon are unremarkable. The
cecum lies in the right lower pelvis abutting the anterior superior
margin of the bladder and uterine fundus and right adnexa. The
appendix is not visualize, but there are no CT findings of
appendicitis.

Vascular/Lymphatic: No significant vascular findings are present. No
enlarged abdominal or pelvic lymph nodes.

Reproductive: Uterus and bilateral adnexa are unremarkable.

Other: No abdominal wall hernia or abnormality. No abdominopelvic
ascites.

Musculoskeletal: Normal.
IMPRESSION: 1. No acute findings within the abdomen pelvis.
2. Appendix not discretely seen, but there are no CT findings to
suggest appendicitis.
3. Under rotated kidneys in small right renal cyst. No other
abnormalities.

## 2019-03-16 IMAGING — US US PELVIS COMPLETE
1 series · 13 of 25 positions shown · non-contrast
Comparison: None.

CLINICAL DATA: Patient with right-sided pelvic pain for 8 hours.

EXAM:
TRANSABDOMINAL AND TRANSVAGINAL ULTRASOUND OF PELVIS
DOPPLER ULTRASOUND OF OVARIES
TECHNIQUE: Both transabdominal and transvaginal ultrasound examinations of the
pelvis were performed. Transabdominal technique was performed for
global imaging of the pelvis including uterus, ovaries, adnexal
regions, and pelvic cul-de-sac.
It was necessary to proceed with endovaginal exam following the
transabdominal exam to visualize the adnexal structures. Color and
duplex Doppler ultrasound was utilized to evaluate blood flow to the
ovaries.

[Series 1: us pelvis complete · 0.21mm/px · 13 of 185 slices shown]
[im 1/185]
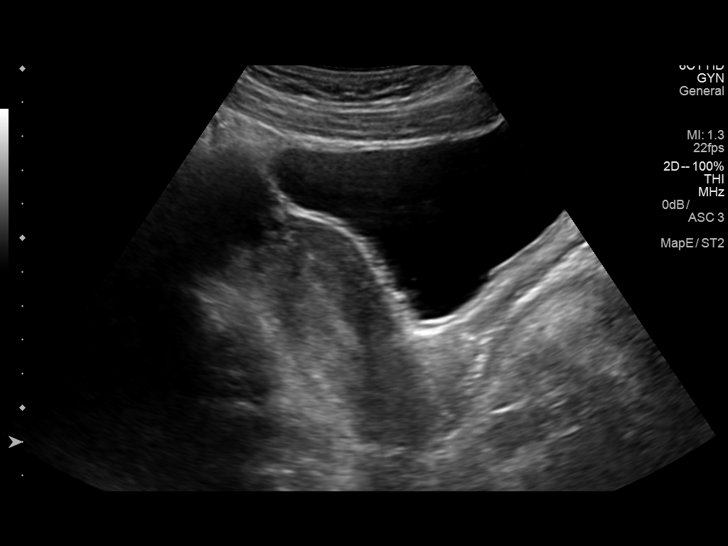
[im 16/185]
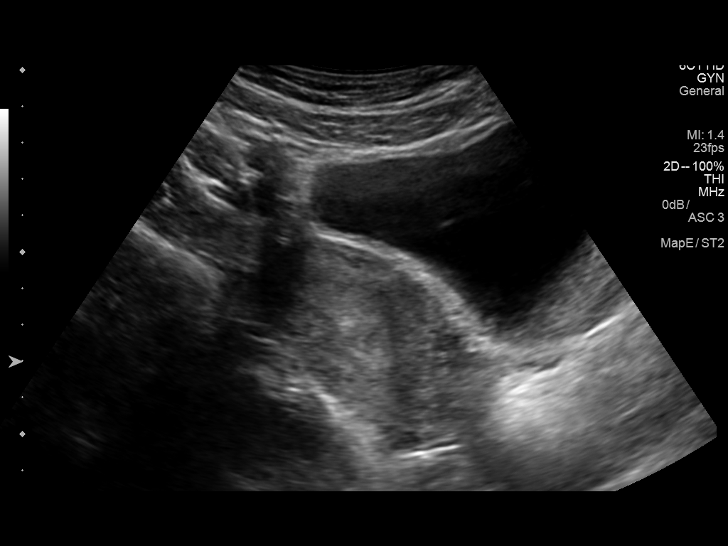
[im 31/185]
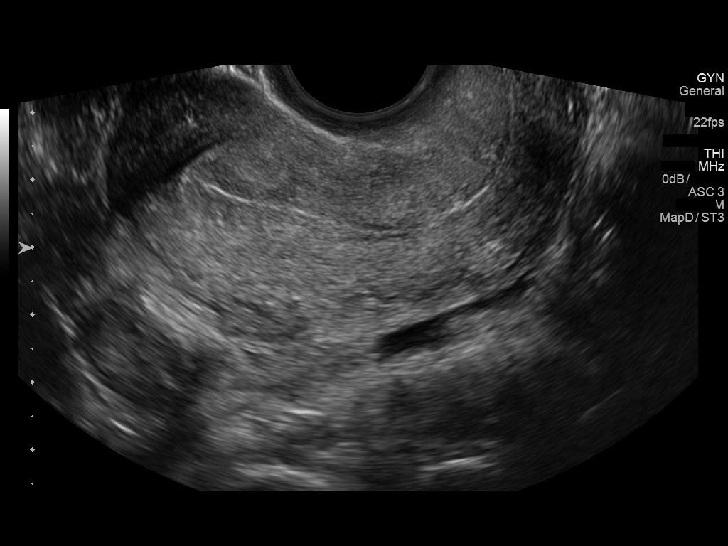
[im 47/185]
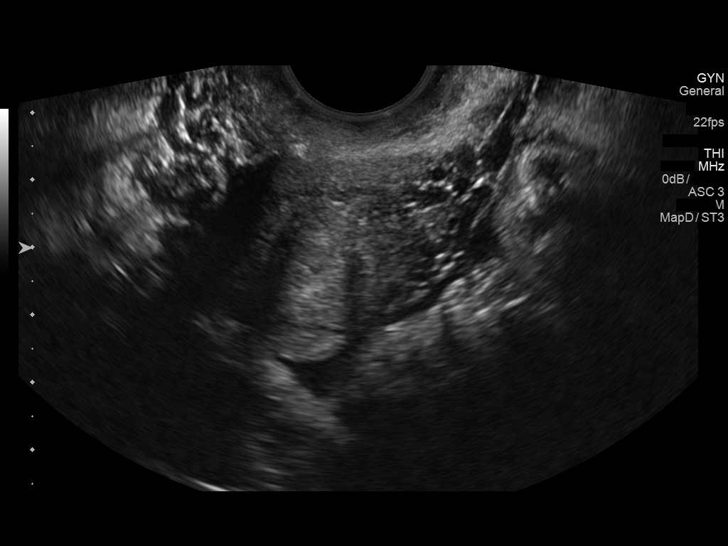
[im 62/185]
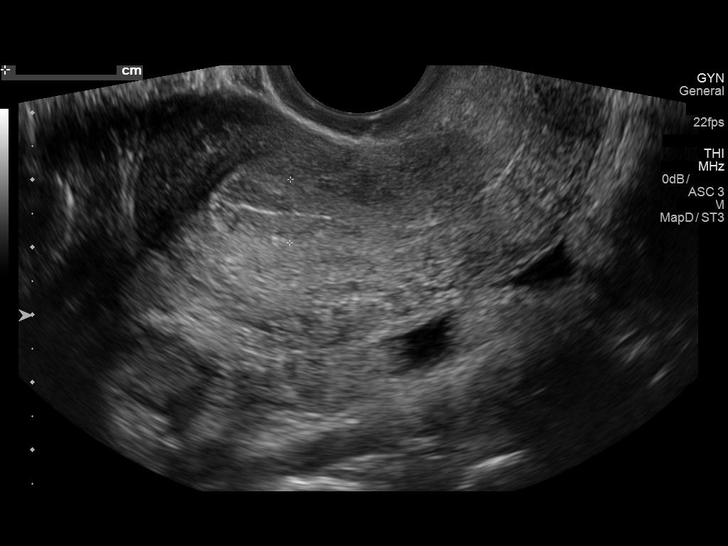
[im 77/185]
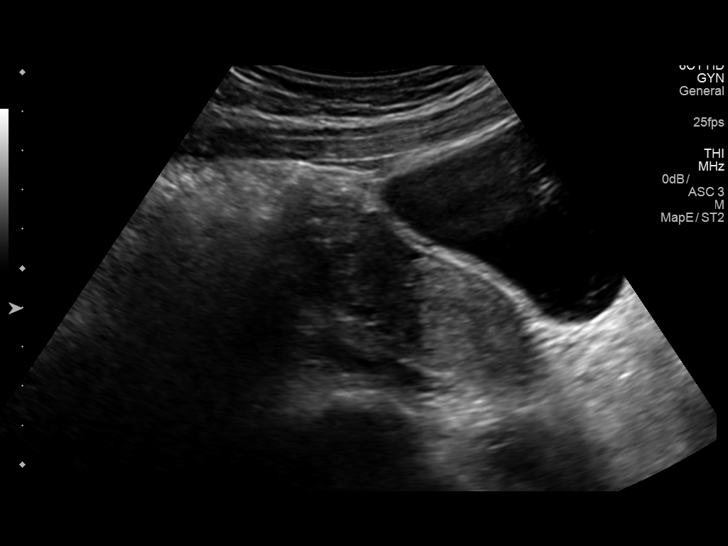
[im 93/185]
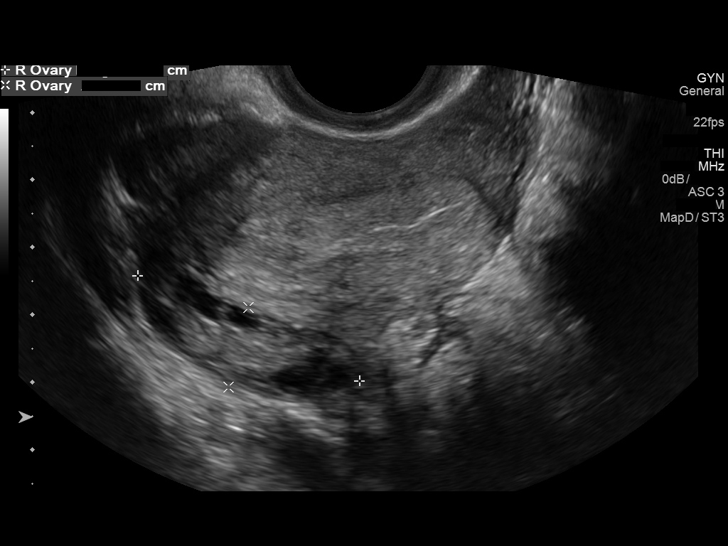
[im 108/185]
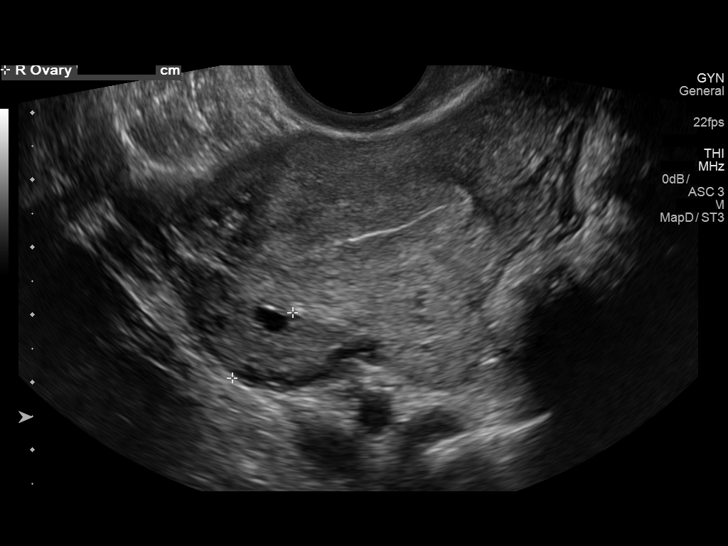
[im 123/185]
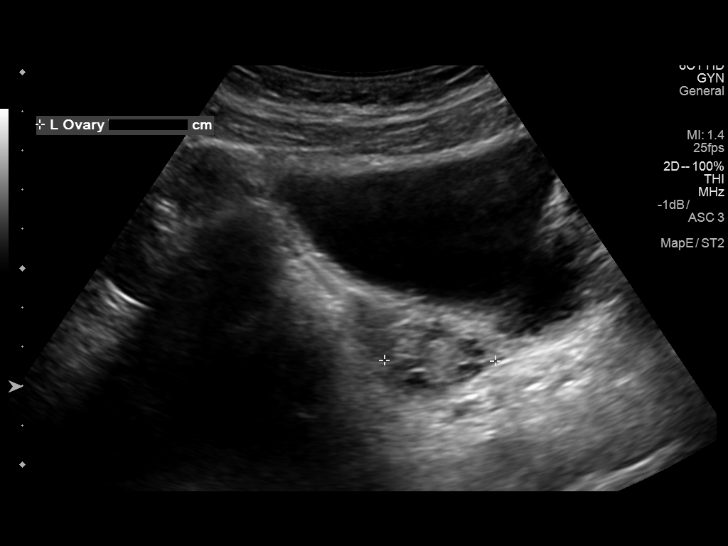
[im 139/185]
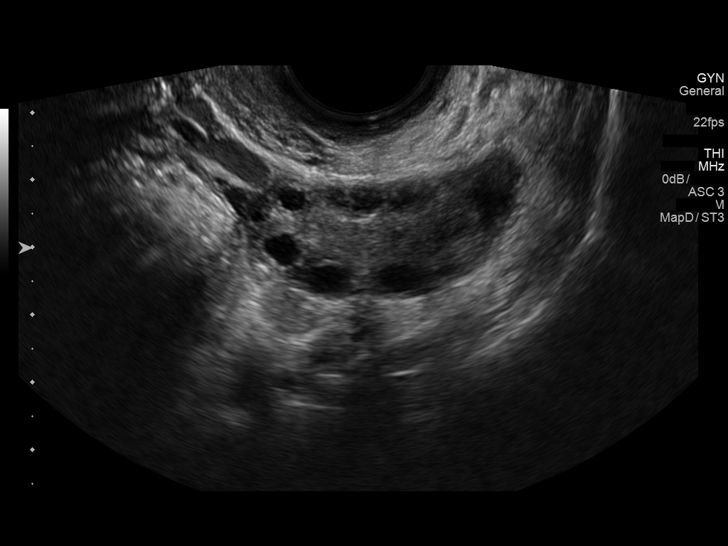
[im 154/185]
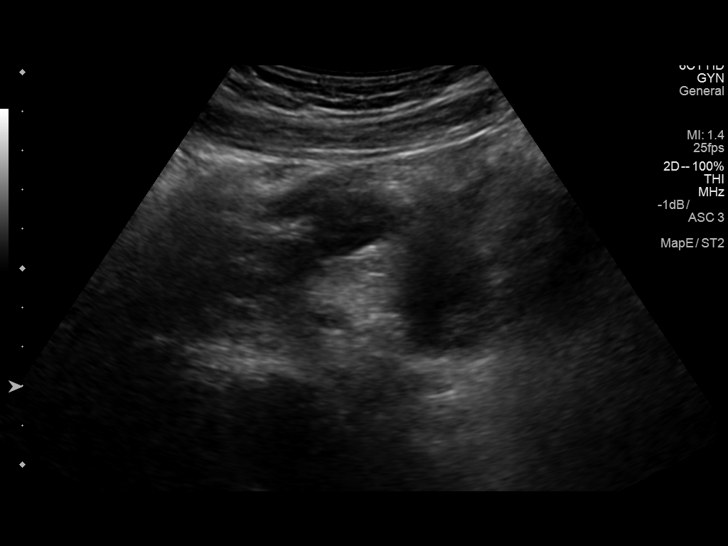
[im 169/185]
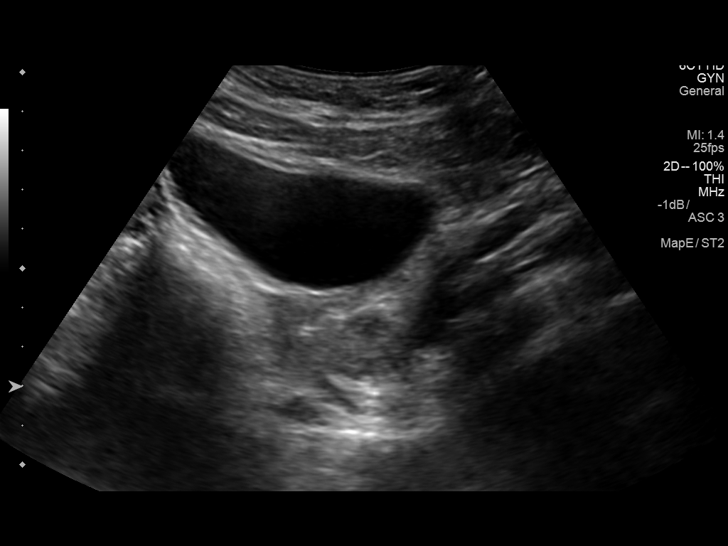
[im 185/185]
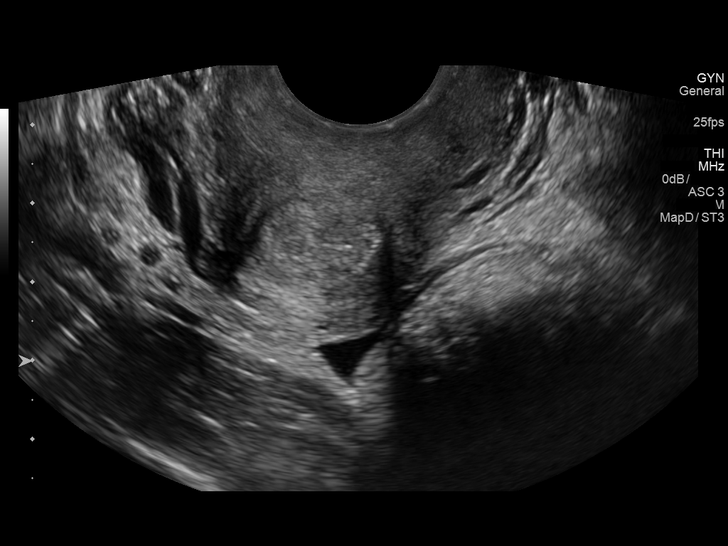

[13 of 25 positions shown; findings below may reference images not displayed]

FINDINGS: Uterus

Measurements: 7.6 x 3.9 x 5.1 cm. No fibroids or other mass
visualized.

Endometrium

Thickness: 9.4 mm.  No focal abnormality visualized.

Right ovary

Measurements: 3.6 x 1.2 x 1.3 cm. Normal appearance/no adnexal mass.

Left ovary

Measurements: 4.1 x 1.9 x 2.2 cm. Normal appearance/no adnexal mass.

Pulsed Doppler evaluation of both ovaries demonstrates normal
low-resistance arterial and venous waveforms.

Other findings

Small amount of free fluid in the pelvis.
IMPRESSION: Normal sonographic appearance of the ovaries. No sonographic
evidence to suggest torsion.

No acute process identified.

## 2019-08-06 ENCOUNTER — Telehealth (HOSPITAL_COMMUNITY): Payer: Federal, State, Local not specified - Other | Admitting: Psychiatry
